# Patient Record
Sex: Female | Born: 1975
Health system: Southern US, Community
[De-identification: ages and names within clinical notes are randomized; demographics above are authoritative.]

## PROBLEM LIST (undated history)

## (undated) DIAGNOSIS — D573 Sickle-cell trait: Secondary | ICD-10-CM

## (undated) DIAGNOSIS — R2231 Localized swelling, mass and lump, right upper limb: Secondary | ICD-10-CM

## (undated) DIAGNOSIS — Z8742 Personal history of other diseases of the female genital tract: Secondary | ICD-10-CM

## (undated) HISTORY — DX: Sickle-cell trait: D57.3

---

## 2005-10-07 HISTORY — PX: LAPAROSCOPIC OVARIAN CYSTECTOMY: SUR786

## 2009-01-21 ENCOUNTER — Emergency Department (HOSPITAL_BASED_OUTPATIENT_CLINIC_OR_DEPARTMENT_OTHER): Admission: EM | Admit: 2009-01-21 | Discharge: 2009-01-21 | Payer: Self-pay | Admitting: Emergency Medicine

## 2009-01-21 ENCOUNTER — Ambulatory Visit: Payer: Self-pay | Admitting: Radiology

## 2011-09-20 ENCOUNTER — Other Ambulatory Visit: Payer: Self-pay | Admitting: Family Medicine

## 2011-09-20 DIAGNOSIS — R609 Edema, unspecified: Secondary | ICD-10-CM

## 2011-10-04 ENCOUNTER — Ambulatory Visit
Admission: RE | Admit: 2011-10-04 | Discharge: 2011-10-04 | Disposition: A | Discharge status: Home / Self Care | Payer: Managed Care, Other (non HMO) | Source: Ambulatory Visit | Attending: Family Medicine | Admitting: Family Medicine

## 2011-10-04 DIAGNOSIS — R609 Edema, unspecified: Secondary | ICD-10-CM

## 2011-10-05 ENCOUNTER — Other Ambulatory Visit: Payer: Self-pay

## 2012-07-03 ENCOUNTER — Encounter: Payer: Self-pay | Admitting: Obstetrics and Gynecology

## 2012-07-03 ENCOUNTER — Ambulatory Visit (INDEPENDENT_AMBULATORY_CARE_PROVIDER_SITE_OTHER): Payer: Managed Care, Other (non HMO) | Admitting: Obstetrics and Gynecology

## 2012-07-03 ENCOUNTER — Telehealth: Payer: Self-pay | Admitting: Obstetrics and Gynecology

## 2012-07-03 VITALS — BP 98/62 | HR 80 | Ht 68.0 in | Wt 174.0 lb

## 2012-07-03 DIAGNOSIS — Z01419 Encounter for gynecological examination (general) (routine) without abnormal findings: Secondary | ICD-10-CM

## 2012-07-03 DIAGNOSIS — Z1231 Encounter for screening mammogram for malignant neoplasm of breast: Secondary | ICD-10-CM

## 2012-07-03 DIAGNOSIS — Z124 Encounter for screening for malignant neoplasm of cervix: Secondary | ICD-10-CM

## 2012-07-03 MED ORDER — NORETHINDRONE-ETH ESTRADIOL 0.5-35 MG-MCG PO TABS: 1.0000 | ORAL_TABLET | Freq: Every day | ORAL | Status: DC

## 2012-07-03 NOTE — Telephone Encounter (Signed)
Orders put in for mammo

## 2012-07-03 NOTE — Progress Notes (Signed)
Regular Periods: yes Mammogram: yes  Monthly Breast Ex.: yes Exercise: no  Tetanus < 10 years: no Pt is unsure Seatbelts: yes  NI. Bladder Functn.: yes Abuse at home: no  Daily BM's: yes Stressful Work: yes  Healthy Diet: no Sigmoid-Colonoscopy: n/a  Calcium: no Medical problems this year: none   LAST PAP:06/2011 wnl  Contraception: pills  Mammogram:  3 years ago, pt has family hx of breast cancer  PCP: none  PMH: n/a  FMH: n/a  Last Bone Scan: n/a   NEW GYNECOLOGIC EXAMINATION  Wanda Crawford is an 36 y.o. female, G1P1001, who presents to the [facility] of [facility] for a new patient gynecologic examination.  Her complains are:her birth control pills were switched by her pharmacy.  She does not feel well on her current pills. .     Pertinent Gynecological History: Patient's last menstrual period was 06/14/2012. Menses: heavy with cramping Menarche: 13 Contraception: OCP (estrogen/progesterone) DES exposure: unknown Blood transfusions: none Sexually transmitted diseases: The patient reports a past history of: chlamydia. Previous GYN Procedures: laparoscopy  Last mammogram: normal Date: 2009 Last pap: normal Date: 2012 History of Abnormal Pap Smears:  Yes  Obstetrical History:  Vaginal Deliveries at Term:      0 Preterm Vaginal Deliveries:      0 Cesarean Deliveries at Term:  1 Preterm Cesareans:                 0 Miscarriages:                            0 Abortions:                                  0    Past Medical History  Diagnosis Date  . Sickle cell trait   . Anemia     low iron  . Headache, migraine   . Yeast infection   . Bacterial infection   . Abnormal Pap smear 2011    Past Surgical History  Procedure Date  . Laparoscopy 2006    Family History  Problem Relation Age of Onset  . Cancer Maternal Grandmother     breast  . Cancer Mother     breast     Social History:  reports that she has  never smoked. She does not have any smokeless tobacco history on file. She reports that she drinks about one ounce of alcohol per week. She reports that she does not use illicit drugs.  Allergies:  Allergies  Allergen Reactions  . Penicillins     Medications: I have reviewed the patient's current medications. Birth control peeled, sodium bicarb, iron  Review of Systems:  See history of present illness and gynecologic history  Physical Examination:  Blood pressure 98/62, pulse 80, height 5\' 8"  (1.727 m), weight 174 lb (78.926 kg), last menstrual period 06/14/2012. Body mass index is 26.46 kg/(m^2).  General: alert and no distress Resp: clear to auscultation bilaterally Cardio: regular rate and rhythm, S1, S2 normal, no murmur, click, rub or gallop GI: soft, non-tender; bowel sounds normal; no masses,  no organomegaly Breast: No masses, skin changes, or discharge  External genitalia: normal general appearance Vagina: No masses or lesions, relaxation: no Cervix: Nontender, No lesions Uterus: Normal size, shape, and consistency Adnexa: No masses, nontender Rectovaginal exam:Deferred  No results found for this or  any previous visit (from the past 48 hour(s)).  Wet Prep: None  Urine Pregnancy Test: None  Assessment:  Normal female examination  Overweight or obese: Yes   Pelvic relaxation: No  Does not like current birth control pills  Mother with premenopausal breast cancer.  Maternal grandmother with postmenopausal breast cancer.   Plan:    mammogram pap smear return annually or prn Contraception:Necon 1/35.  Patient denies contraindications   STD screen request:  Yes , GC and Chlamydia  The updated Pap smear screening guidelines were discussed with the patient. The patient requested that I obtain a Pap smear: Yes.  Kegel exercises discussed: Yes.  Proper diet and regular exercise were reviewed.  Annual mammograms recommended starting at age 15. Proper  breast care was discussed.  Screening colonoscopy is recommended beginning at age 48.  Regular health maintenance was reviewed.  Sleep hygiene was discussed.  Medications Prescribed:  Necon 1/35  Return to Office: 1 year or as needed.  One year for annual exam or as needed for difficulties.  Leonard Schwartz, M.D. 07/03/2012

## 2012-07-06 LAB — PAP IG, CT-NG, RFX HPV ASCU

## 2012-07-26 ENCOUNTER — Ambulatory Visit
Admission: RE | Admit: 2012-07-26 | Discharge: 2012-07-26 | Disposition: A | Discharge status: Home / Self Care | Payer: Managed Care, Other (non HMO) | Source: Ambulatory Visit | Attending: Obstetrics and Gynecology | Admitting: Obstetrics and Gynecology

## 2012-07-26 DIAGNOSIS — Z1231 Encounter for screening mammogram for malignant neoplasm of breast: Secondary | ICD-10-CM

## 2012-07-31 ENCOUNTER — Encounter: Payer: Self-pay | Admitting: Obstetrics and Gynecology

## 2013-04-30 DIAGNOSIS — D509 Iron deficiency anemia, unspecified: Secondary | ICD-10-CM | POA: Insufficient documentation

## 2013-04-30 DIAGNOSIS — R319 Hematuria, unspecified: Secondary | ICD-10-CM | POA: Insufficient documentation

## 2013-05-04 ENCOUNTER — Emergency Department (HOSPITAL_BASED_OUTPATIENT_CLINIC_OR_DEPARTMENT_OTHER): Payer: No Typology Code available for payment source

## 2013-05-04 ENCOUNTER — Encounter (HOSPITAL_BASED_OUTPATIENT_CLINIC_OR_DEPARTMENT_OTHER): Payer: Self-pay | Admitting: *Deleted

## 2013-05-04 ENCOUNTER — Emergency Department (HOSPITAL_BASED_OUTPATIENT_CLINIC_OR_DEPARTMENT_OTHER)
Admission: EM | Admit: 2013-05-04 | Discharge: 2013-05-04 | Disposition: A | Discharge status: Home / Self Care | Payer: No Typology Code available for payment source | Attending: Emergency Medicine | Admitting: Emergency Medicine

## 2013-05-04 DIAGNOSIS — S139XXA Sprain of joints and ligaments of unspecified parts of neck, initial encounter: Secondary | ICD-10-CM | POA: Insufficient documentation

## 2013-05-04 DIAGNOSIS — Z8619 Personal history of other infectious and parasitic diseases: Secondary | ICD-10-CM | POA: Insufficient documentation

## 2013-05-04 DIAGNOSIS — S39012A Strain of muscle, fascia and tendon of lower back, initial encounter: Secondary | ICD-10-CM

## 2013-05-04 DIAGNOSIS — S161XXA Strain of muscle, fascia and tendon at neck level, initial encounter: Secondary | ICD-10-CM

## 2013-05-04 DIAGNOSIS — Z79899 Other long term (current) drug therapy: Secondary | ICD-10-CM | POA: Insufficient documentation

## 2013-05-04 DIAGNOSIS — Y92410 Unspecified street and highway as the place of occurrence of the external cause: Secondary | ICD-10-CM | POA: Insufficient documentation

## 2013-05-04 DIAGNOSIS — D649 Anemia, unspecified: Secondary | ICD-10-CM | POA: Insufficient documentation

## 2013-05-04 DIAGNOSIS — Z880 Allergy status to penicillin: Secondary | ICD-10-CM | POA: Insufficient documentation

## 2013-05-04 DIAGNOSIS — S335XXA Sprain of ligaments of lumbar spine, initial encounter: Secondary | ICD-10-CM | POA: Insufficient documentation

## 2013-05-04 DIAGNOSIS — Z3202 Encounter for pregnancy test, result negative: Secondary | ICD-10-CM | POA: Insufficient documentation

## 2013-05-04 DIAGNOSIS — Y9389 Activity, other specified: Secondary | ICD-10-CM | POA: Insufficient documentation

## 2013-05-04 DIAGNOSIS — Z8679 Personal history of other diseases of the circulatory system: Secondary | ICD-10-CM | POA: Insufficient documentation

## 2013-05-04 DIAGNOSIS — IMO0002 Reserved for concepts with insufficient information to code with codable children: Secondary | ICD-10-CM | POA: Insufficient documentation

## 2013-05-04 LAB — URINALYSIS, ROUTINE W REFLEX MICROSCOPIC
Bilirubin Urine: NEGATIVE
Glucose, UA: NEGATIVE mg/dL
Hgb urine dipstick: NEGATIVE
Ketones, ur: NEGATIVE mg/dL
Leukocytes, UA: NEGATIVE
Nitrite: NEGATIVE
Protein, ur: NEGATIVE mg/dL
Specific Gravity, Urine: 1.014 (ref 1.005–1.030)
Urobilinogen, UA: 0.2 mg/dL (ref 0.0–1.0)
pH: 7.5 (ref 5.0–8.0)

## 2013-05-04 LAB — PREGNANCY, URINE: Preg Test, Ur: NEGATIVE

## 2013-05-04 MED ORDER — NAPROXEN 500 MG PO TABS: 500.0000 mg | ORAL_TABLET | Freq: Two times a day (BID) | ORAL | Status: DC

## 2013-05-04 MED ORDER — CYCLOBENZAPRINE HCL 10 MG PO TABS: 10.0000 mg | ORAL_TABLET | Freq: Two times a day (BID) | ORAL | Status: DC | PRN

## 2013-05-04 MED ORDER — IBUPROFEN 800 MG PO TABS
800.0000 mg | ORAL_TABLET | Freq: Once | ORAL | Status: AC
  Administered 2013-05-04: 800 mg via ORAL
  Filled 2013-05-04: qty 1

## 2013-05-04 NOTE — ED Provider Notes (Signed)
Medical screening examination/treatment/procedure(s) were performed by non-physician practitioner and as supervising physician I was immediately available for consultation/collaboration.  Raeford Razor, MD 05/04/13 [phone]
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
## 2013-07-10 ENCOUNTER — Other Ambulatory Visit: Payer: Self-pay

## 2013-07-10 DIAGNOSIS — Z1231 Encounter for screening mammogram for malignant neoplasm of breast: Secondary | ICD-10-CM

## 2013-07-29 ENCOUNTER — Ambulatory Visit: Payer: Managed Care, Other (non HMO)

## 2013-08-04 ENCOUNTER — Encounter (HOSPITAL_BASED_OUTPATIENT_CLINIC_OR_DEPARTMENT_OTHER): Payer: Self-pay | Admitting: *Deleted

## 2013-08-04 ENCOUNTER — Emergency Department (HOSPITAL_BASED_OUTPATIENT_CLINIC_OR_DEPARTMENT_OTHER)
Admission: EM | Admit: 2013-08-04 | Discharge: 2013-08-04 | Disposition: A | Discharge status: Home / Self Care | Payer: Managed Care, Other (non HMO) | Attending: Emergency Medicine | Admitting: Emergency Medicine

## 2013-08-04 DIAGNOSIS — Z79899 Other long term (current) drug therapy: Secondary | ICD-10-CM | POA: Insufficient documentation

## 2013-08-04 DIAGNOSIS — IMO0001 Reserved for inherently not codable concepts without codable children: Secondary | ICD-10-CM | POA: Insufficient documentation

## 2013-08-04 DIAGNOSIS — D509 Iron deficiency anemia, unspecified: Secondary | ICD-10-CM | POA: Insufficient documentation

## 2013-08-04 DIAGNOSIS — J0100 Acute maxillary sinusitis, unspecified: Secondary | ICD-10-CM | POA: Insufficient documentation

## 2013-08-04 DIAGNOSIS — D573 Sickle-cell trait: Secondary | ICD-10-CM | POA: Insufficient documentation

## 2013-08-04 DIAGNOSIS — Z880 Allergy status to penicillin: Secondary | ICD-10-CM | POA: Insufficient documentation

## 2013-08-04 DIAGNOSIS — Z791 Long term (current) use of non-steroidal anti-inflammatories (NSAID): Secondary | ICD-10-CM | POA: Insufficient documentation

## 2013-08-04 DIAGNOSIS — J069 Acute upper respiratory infection, unspecified: Secondary | ICD-10-CM

## 2013-08-04 DIAGNOSIS — Z8619 Personal history of other infectious and parasitic diseases: Secondary | ICD-10-CM | POA: Insufficient documentation

## 2013-08-04 DIAGNOSIS — G43909 Migraine, unspecified, not intractable, without status migrainosus: Secondary | ICD-10-CM | POA: Insufficient documentation

## 2013-08-04 LAB — RAPID STREP SCREEN (MED CTR MEBANE ONLY): Streptococcus, Group A Screen (Direct): NEGATIVE

## 2013-08-04 MED ORDER — IBUPROFEN 600 MG PO TABS: 600.0000 mg | ORAL_TABLET | Freq: Four times a day (QID) | ORAL | Status: DC | PRN

## 2013-08-04 MED ORDER — MOMETASONE FUROATE 50 MCG/ACT NA SUSP: 2.0000 | Freq: Every day | NASAL | Status: DC

## 2013-08-04 MED ORDER — AZITHROMYCIN 250 MG PO TABS: ORAL_TABLET | ORAL | Status: DC

## 2013-08-04 MED ORDER — OXYMETAZOLINE HCL 0.05 % NA SOLN: 2.0000 | Freq: Two times a day (BID) | NASAL | Status: DC

## 2013-08-04 NOTE — Discharge Instructions (Signed)
Sinusitis  Sinusitis is redness, soreness, and swelling (inflammation) of the paranasal sinuses. Paranasal sinuses are air pockets within the bones of your face (beneath the eyes, the middle of the forehead, or above the eyes). In healthy paranasal sinuses, mucus is able to drain out, and air is able to circulate through them by way of your nose. However, when your paranasal sinuses are inflamed, mucus and air can become trapped. This can allow bacteria and other germs to grow and cause infection.  Sinusitis can develop quickly and last only a short time (acute) or continue over a long period (chronic). Sinusitis that lasts for more than 12 weeks is considered chronic.   CAUSES   Causes of sinusitis include:  · Allergies.  · Structural abnormalities, such as displacement of the cartilage that separates your nostrils (deviated septum), which can decrease the air flow through your nose and sinuses and affect sinus drainage.  · Functional abnormalities, such as when the small hairs (cilia) that line your sinuses and help remove mucus do not work properly or are not present.  SYMPTOMS   Symptoms of acute and chronic sinusitis are the same. The primary symptoms are pain and pressure around the affected sinuses. Other symptoms include:  · Upper toothache.  · Earache.  · Headache.  · Bad breath.  · Decreased sense of smell and taste.  · A cough, which worsens when you are lying flat.  · Fatigue.  · Fever.  · Thick drainage from your nose, which often is green and may contain pus (purulent).  · Swelling and warmth over the affected sinuses.  DIAGNOSIS   Your caregiver will perform a physical exam. During the exam, your caregiver may:  · Look in your nose for signs of abnormal growths in your nostrils (nasal polyps).  · Tap over the affected sinus to check for signs of infection.  · View the inside of your sinuses (endoscopy) with a special imaging device with a light attached (endoscope), which is inserted into your  sinuses.  If your caregiver suspects that you have chronic sinusitis, one or more of the following tests may be recommended:  · Allergy tests.  · Nasal culture A sample of mucus is taken from your nose and sent to a lab and screened for bacteria.  · Nasal cytology A sample of mucus is taken from your nose and examined by your caregiver to determine if your sinusitis is related to an allergy.  TREATMENT   Most cases of acute sinusitis are related to a viral infection and will resolve on their own within 10 days. Sometimes medicines are prescribed to help relieve symptoms (pain medicine, decongestants, nasal steroid sprays, or saline sprays).   However, for sinusitis related to a bacterial infection, your caregiver will prescribe antibiotic medicines. These are medicines that will help kill the bacteria causing the infection.   Rarely, sinusitis is caused by a fungal infection. In theses cases, your caregiver will prescribe antifungal medicine.  For some cases of chronic sinusitis, surgery is needed. Generally, these are cases in which sinusitis recurs more than 3 times per year, despite other treatments.  HOME CARE INSTRUCTIONS   · Drink plenty of water. Water helps thin the mucus so your sinuses can drain more easily.  · Use a humidifier.  · Inhale steam 3 to 4 times a day (for example, sit in the bathroom with the shower running).  · Apply a warm, moist washcloth to your face 3 to 4 times a day,   or as directed by your caregiver.  · Use saline nasal sprays to help moisten and clean your sinuses.  · Take over-the-counter or prescription medicines for pain, discomfort, or fever only as directed by your caregiver.  SEEK IMMEDIATE MEDICAL CARE IF:  · You have increasing pain or severe headaches.  · You have nausea, vomiting, or drowsiness.  · You have swelling around your face.  · You have vision problems.  · You have a stiff neck.  · You have difficulty breathing.  MAKE SURE YOU:   · Understand these  instructions.  · Will watch your condition.  · Will get help right away if you are not doing well or get worse.  Document Released: 10/24/2005 Document Revised: 01/16/2012 Document Reviewed: 11/08/2011  ExitCare® Patient Information ©2014 [employer].  Upper Respiratory Infection, Adult  An upper respiratory infection (URI) is also sometimes known as the common cold. The upper respiratory tract includes the nose, sinuses, throat, trachea, and bronchi. Bronchi are the airways leading to the lungs. Most people improve within 1 week, but symptoms can last up to 2 weeks. A residual cough may last even longer.   CAUSES  Many different viruses can infect the tissues lining the upper respiratory tract. The tissues become irritated and inflamed and often become very moist. Mucus production is also common. A cold is contagious. You can easily spread the virus to others by oral contact. This includes kissing, sharing a glass, coughing, or sneezing. Touching your mouth or nose and then touching a surface, which is then touched by another person, can also spread the virus.  SYMPTOMS   Symptoms typically develop 1 to 3 days after you come in contact with a cold virus. Symptoms vary from person to person. They may include:  · Runny nose.  · Sneezing.  · Nasal congestion.  · Sinus irritation.  · Sore throat.  · Loss of voice (laryngitis).  · Cough.  · Fatigue.  · Muscle aches.  · Loss of appetite.  · Headache.  · Low-grade fever.  DIAGNOSIS   You might diagnose your own cold based on familiar symptoms, since most people get a cold 2 to 3 times a year. Your caregiver can confirm this based on your exam. Most importantly, your caregiver can check that your symptoms are not due to another disease such as strep throat, sinusitis, pneumonia, asthma, or epiglottitis. Blood tests, throat tests, and X-rays are not necessary to diagnose a common cold, but they may sometimes be helpful in excluding other more serious diseases. Your  caregiver will decide if any further tests are required.  RISKS AND COMPLICATIONS   You may be at risk for a more severe case of the common cold if you smoke cigarettes, have chronic heart disease (such as heart failure) or lung disease (such as asthma), or if you have a weakened immune system. The very young and very old are also at risk for more serious infections. Bacterial sinusitis, middle ear infections, and bacterial pneumonia can complicate the common cold. The common cold can worsen asthma and chronic obstructive pulmonary disease (COPD). Sometimes, these complications can require emergency medical care and may be life-threatening.  PREVENTION   The best way to protect against getting a cold is to practice good hygiene. Avoid oral or hand contact with people with cold symptoms. Wash your hands often if contact occurs. There is no clear evidence that vitamin C, vitamin E, echinacea, or exercise reduces the chance of developing a cold. However,   it is always recommended to get plenty of rest and practice good nutrition.  TREATMENT   Treatment is directed at relieving symptoms. There is no cure. Antibiotics are not effective, because the infection is caused by a virus, not by bacteria. Treatment may include:  · Increased fluid intake. Sports drinks offer valuable electrolytes, sugars, and fluids.  · Breathing heated mist or steam (vaporizer or shower).  · Eating chicken soup or other clear broths, and maintaining good nutrition.  · Getting plenty of rest.  · Using gargles or lozenges for comfort.  · Controlling fevers with ibuprofen or acetaminophen as directed by your caregiver.  · Increasing usage of your inhaler if you have asthma.  Zinc gel and zinc lozenges, taken in the first 24 hours of the common cold, can shorten the duration and lessen the severity of symptoms. Pain medicines may help with fever, muscle aches, and throat pain. A variety of non-prescription medicines are available to treat congestion  and runny nose. Your caregiver can make recommendations and may suggest nasal or lung inhalers for other symptoms.   HOME CARE INSTRUCTIONS   · Only take over-the-counter or prescription medicines for pain, discomfort, or fever as directed by your caregiver.  · Use a warm mist humidifier or inhale steam from a shower to increase air moisture. This may keep secretions moist and make it easier to breathe.  · Drink enough water and fluids to keep your urine clear or pale yellow.  · Rest as needed.  · Return to work when your temperature has returned to normal or as your caregiver advises. You may need to stay home longer to avoid infecting others. You can also use a face mask and careful hand washing to prevent spread of the virus.  SEEK MEDICAL CARE IF:   · After the first few days, you feel you are getting worse rather than better.  · You need your caregiver's advice about medicines to control symptoms.  · You develop chills, worsening shortness of breath, or brown or red sputum. These may be signs of pneumonia.  · You develop yellow or brown nasal discharge or pain in the face, especially when you bend forward. These may be signs of sinusitis.  · You develop a fever, swollen neck glands, pain with swallowing, or white areas in the back of your throat. These may be signs of strep throat.  SEEK IMMEDIATE MEDICAL CARE IF:   · You have a fever.  · You develop severe or persistent headache, ear pain, sinus pain, or chest pain.  · You develop wheezing, a prolonged cough, cough up blood, or have a change in your usual mucus (if you have chronic lung disease).  · You develop sore muscles or a stiff neck.  Document Released: 04/19/2001 Document Revised: 01/16/2012 Document Reviewed: 02/25/2011  ExitCare® Patient Information ©2014 [employer].

## 2013-08-04 NOTE — ED Provider Notes (Signed)
CSN: [id number]     Arrival date & time 08/04/13  1851 History  This chart was scribed for Loren Racer, MD by Shari Heritage, ED Scribe. The patient was seen in room MH11/MH11. Patient's care was started at 8:25 PM.    Chief Complaint  Patient presents with  . Sore Throat    Patient is a 37 y.o. female presenting with pharyngitis.  Sore Throat This is a new problem. The current episode started 2 days ago. The problem occurs constantly. The problem has not changed since onset.Associated symptoms include headaches. Pertinent negatives include no chest pain, no abdominal pain and no shortness of breath. Treatments tried: Mucinex DM.    HPI Comments: Wanda Crawford is a 37 y.o. female who presents to the Emergency Department complaining of constant, moderate sore throat onset 2 days ago. There is associated subjective fever, congestion, headache, body aches, rhinorrhea with yellow mucous. She describes headache as a migraine and states that she has had them infrequently in the past. She denies cough, ear fullness, otalgia, shortness of breath, facial pain, chills or any other symptoms at this time. She has been taking Mucinex DM at home. She works at a nursing home. She has a medical history of sickle cell trait.   Past Medical History  Diagnosis Date  . Sickle cell trait   . Anemia     low iron  . Headache, migraine   . Yeast infection   . Bacterial infection   . Abnormal Pap smear 2011   Past Surgical History  Procedure Laterality Date  . Laparoscopy  2006   Family History  Problem Relation Age of Onset  . Cancer Maternal Grandmother     breast  . Cancer Mother     breast    History  Substance Use Topics  . Smoking status: Never Smoker   . Smokeless tobacco: Never Used  . Alcohol Use: 4.2 oz/week    7 Glasses of wine per week   OB History   Grav Para Term Preterm Abortions TAB SAB Ect Mult Living   1 1 1       1      Review of Systems  Constitutional: Positive for  fever. Negative for chills.  HENT: Positive for congestion and rhinorrhea. Negative for ear pain and ear discharge.   Respiratory: Negative for cough and shortness of breath.   Cardiovascular: Negative for chest pain.  Gastrointestinal: Negative for abdominal pain.  Musculoskeletal: Positive for myalgias.  Neurological: Positive for headaches.  All other systems reviewed and are negative.    Allergies  Penicillins  Home Medications   Current Outpatient Rx  Name  Route  Sig  Dispense  Refill  . IRON PO   Oral   Take by mouth. Pt is unsure of dosage         . norethindrone-ethinyl estradiol (NECON 0.5/35, 28,) 0.5-35 MG-MCG tablet   Oral   Take 1 tablet by mouth daily.   1 Package   11   . SODIUM BICARBONATE PO   Oral   Take by mouth.         . cyclobenzaprine (FLEXERIL) 10 MG tablet   Oral   Take 1 tablet (10 mg total) by mouth 2 (two) times daily as needed for muscle spasms.   20 tablet   0   . levonorgestrel-ethinyl estradiol (NORDETTE) 0.15-30 MG-MCG tablet   Oral   Take 1 tablet by mouth daily.         . naproxen (NAPROSYN)  500 MG tablet   Oral   Take 1 tablet (500 mg total) by mouth 2 (two) times daily.   30 tablet   0    Triage Vitals: BP 107/70  Pulse 112  Temp(Src) 99 F (37.2 C) (Oral)  Resp 20  Ht 5\' 8"  (1.727 m)  Wt 160 lb (72.576 kg)  BMI 24.33 kg/m2  SpO2 100%  LMP 07/17/2013 Physical Exam  Constitutional: She is oriented to person, place, and time. She appears well-developed and well-nourished. No distress.  HENT:  Head: Normocephalic and atraumatic.  Nose: Mucosal edema and rhinorrhea present. Right sinus exhibits maxillary sinus tenderness. Left sinus exhibits maxillary sinus tenderness.  Mouth/Throat: Oropharynx is clear and moist.  Nasal mucosal edema. Diffusely tender maxillary and frontal sinuses bilaterally.  Eyes: Conjunctivae and EOM are normal. Pupils are equal, round, and reactive to light.  Neck: Normal range of motion.  Neck supple.  No nuchal rigidity.   Cardiovascular: Normal rate, regular rhythm and normal heart sounds.   No murmur heard. Pulmonary/Chest: Effort normal and breath sounds normal. No respiratory distress. She has no wheezes. She has no rales.  Musculoskeletal: Normal range of motion.  Neurological: She is alert and oriented to person, place, and time.  Skin: Skin is warm and dry.  Psychiatric: She has a normal mood and affect. Her behavior is normal.    ED Course  Procedures (including critical care time) DIAGNOSTIC STUDIES: Oxygen Saturation is 100% on room air, normal by my interpretation.    COORDINATION OF CARE: 8:29 PM- Patient informed of current plan for treatment and evaluation and agrees with plan at this time.   Results for orders placed during the hospital encounter of 08/04/13  RAPID STREP SCREEN      Result Value Range   Streptococcus, Group A Screen (Direct) NEGATIVE  NEGATIVE     Imaging Review No results found.  MDM  I personally performed the services described in this documentation, which was scribed in my presence. The recorded information has been reviewed and is accurate. Patient is well-appearing. Her symptoms are consistent with URI and sinusitis. Her lungs are clear she has minimal cough. Believe chest x-ray as needed this point. Return precautions have been given the patient voices understanding.  Loren Racer, MD 08/04/13 2044

## 2013-08-04 NOTE — ED Notes (Signed)
C/o sore throat fever and body aches x 2 days

## 2013-08-07 LAB — CULTURE, GROUP A STREP

## 2014-09-08 ENCOUNTER — Encounter (HOSPITAL_BASED_OUTPATIENT_CLINIC_OR_DEPARTMENT_OTHER): Payer: Self-pay | Admitting: *Deleted

## 2015-04-23 ENCOUNTER — Encounter: Payer: Self-pay | Admitting: Medical

## 2015-04-23 ENCOUNTER — Ambulatory Visit (INDEPENDENT_AMBULATORY_CARE_PROVIDER_SITE_OTHER): Payer: 59 | Admitting: Medical

## 2015-04-23 VITALS — BP 135/85 | HR 79 | Temp 98.5°F | Ht 67.0 in | Wt 178.4 lb

## 2015-04-23 DIAGNOSIS — L989 Disorder of the skin and subcutaneous tissue, unspecified: Secondary | ICD-10-CM | POA: Diagnosis not present

## 2015-04-23 DIAGNOSIS — G43809 Other migraine, not intractable, without status migrainosus: Secondary | ICD-10-CM

## 2015-04-23 DIAGNOSIS — M6740 Ganglion, unspecified site: Secondary | ICD-10-CM

## 2015-04-23 DIAGNOSIS — J301 Allergic rhinitis due to pollen: Secondary | ICD-10-CM

## 2015-04-23 DIAGNOSIS — J309 Allergic rhinitis, unspecified: Secondary | ICD-10-CM | POA: Insufficient documentation

## 2015-04-23 DIAGNOSIS — Z862 Personal history of diseases of the blood and blood-forming organs and certain disorders involving the immune mechanism: Secondary | ICD-10-CM

## 2015-04-23 DIAGNOSIS — G43909 Migraine, unspecified, not intractable, without status migrainosus: Secondary | ICD-10-CM | POA: Insufficient documentation

## 2015-04-23 NOTE — Progress Notes (Signed)
Pre visit review using our clinic review tool, if applicable. No additional management support is needed unless otherwise documented below in the visit note. 

## 2015-04-23 NOTE — Assessment & Plan Note (Signed)
Sickle cell trait.Pt sees hematologist once a year. [place].

## 2015-04-23 NOTE — Assessment & Plan Note (Signed)
-   Pt states 1 yr or more since last migraine. Pt would use excedrin or bc powder.

## 2015-04-23 NOTE — Patient Instructions (Signed)
Refer to dermatologist and hand specialist.  Consider if you want wellness exam over next year. This would include cbc, cmp, tsh, and lipid panel fasting.  Continue current meds for chronic problems.   Follow up 1 month or as needed

## 2015-04-23 NOTE — Assessment & Plan Note (Signed)
By exam the area over 4th mcp region I think favors ganglion cyst. Will refer to hand surgeon.

## 2015-04-23 NOTE — Assessment & Plan Note (Signed)
Refer to dermatologis. Some feature of keloid behind rt ear. Irregular mole lt calf which may need excisoin and path eval. Also can review skin lesion distal 3rd and 4th dip area.

## 2015-04-23 NOTE — Progress Notes (Signed)
Subjective:    Patient ID: Wanda Crawford, female    DOB: Dec 02, 1975, 39 y.o.   MRN: [id number]  HPI  I have reviewed pt PMH, PSH, FH, Social History and Surgical History  Allergies- year round. zytrec occasionaly.  Sickle cell trait.Pt sees hematologist once a year. [place].  Migraines- Pt states 1 yr or more since last migraine. Pt would use excedrin or bc powder.   Abnormal pap smear- 5 years ago followed by gynecologist and resolved per pt. Did not need any procedures per pt.   Only surgery for endometriosis in [address].  Pt mom had breast cancer in 1996. Mom was 41 yo when had. Mom died recently from chf recently.   Pt in for 3 problems. First some raises bumps. Nodule type area on her rt hand. Pt wants to see derm for referral. But she does not have any joint pain. This has been present for about a month.   Also left upper calf mole appearance for 3 month. This has been like this for years.  Also are rt ear lobe area thinks keloid.   LMP- today.   Pt gets regular mammograms since age 32 yr.Those have been negative.   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  Past Medical History  Diagnosis Date  . Sickle cell trait   . Headache, migraine   . Yeast infection   . Bacterial infection   . Abnormal Pap smear 2011  . Allergy   . Sickle cell anemia     Has trait    History   Social History  . Marital Status: Married    Spouse Name: N/A  . Number of Children: N/A  . Years of Education: N/A   Occupational History  . Not on file.   Social History Main Topics  . Smoking status: Never Smoker   . Smokeless tobacco: Never Used  . Alcohol Use: 1.2 oz/week    2 Glasses of wine per week     Comment: 2 glasses of wine.   . Drug Use: No  . Sexual Activity:    Partners: Male    Birth Control/ Protection: Pill     Comment: portia   Other Topics Concern  . Not on file   Social History Narrative    Past Surgical History    Procedure Laterality Date  . Laparoscopy  2006  . Laproscopy      Removed cysts from ovaries    Family History  Problem Relation Age of Onset  . Cancer Maternal Grandmother     breast  . Cancer Mother     breast   . Heart disease Mother     Allergies  Allergen Reactions  . Penicillins     No current outpatient prescriptions on file prior to visit.   No current facility-administered medications on file prior to visit.    BP 135/85 mmHg  Pulse 79  Temp(Src) 98.5 F (36.9 C) (Oral)  Ht 5\' 7"  (1.702 m)  Wt 178 lb 6.4 oz (80.922 kg)  BMI 27.93 kg/m2  SpO2 100%  LMP 04/23/2015     Review of Systems  Constitutional: Negative for fever, chills, diaphoresis, activity change and fatigue.  Respiratory: Negative for cough, chest tightness and shortness of breath.   Cardiovascular: Negative for chest pain, palpitations and leg swelling.  Gastrointestinal: Negative for nausea, vomiting and abdominal pain.  Musculoskeletal: Negative for neck pain and neck stiffness.  Rt 4t didit over mcp joint raised cyst like lesion.  Skin:       Lt calf lesion. Rt ear lobe probable keloid. Rt 3rd digit at dip small raised area dorsal aspect. Rt 4th digit at dip small raised area dorsal aspect.  Neurological: Negative for dizziness, tremors, seizures, syncope, facial asymmetry, speech difficulty, weakness, light-headedness, numbness and headaches.  Psychiatric/Behavioral: Negative for behavioral problems, confusion and agitation. The patient is not nervous/anxious.     Past Medical History  Diagnosis Date  . Sickle cell trait   . Headache, migraine   . Yeast infection   . Bacterial infection   . Abnormal Pap smear 2011  . Allergy   . Sickle cell anemia     Has trait    History   Social History  . Marital Status: Married    Spouse Name: N/A  . Number of Children: N/A  . Years of Education: N/A   Occupational History  . Not on file.   Social History Main Topics  .  Smoking status: Never Smoker   . Smokeless tobacco: Never Used  . Alcohol Use: 1.2 oz/week    2 Glasses of wine per week     Comment: 2 glasses of wine.   . Drug Use: No  . Sexual Activity:    Partners: Male    Birth Control/ Protection: Pill     Comment: portia   Other Topics Concern  . Not on file   Social History Narrative    Past Surgical History  Procedure Laterality Date  . Laparoscopy  2006  . Laproscopy      Removed cysts from ovaries    Family History  Problem Relation Age of Onset  . Cancer Maternal Grandmother     breast  . Cancer Mother     breast   . Heart disease Mother     Allergies  Allergen Reactions  . Penicillins     No current outpatient prescriptions on file prior to visit.   No current facility-administered medications on file prior to visit.    BP 135/85 mmHg  Pulse 79  Temp(Src) 98.5 F (36.9 C) (Oral)  Ht  (1.702 m)  Wt 178 lb 6.4 oz (80.922 kg)  BMI 27.93 kg/m2  SpO2 100%  LMP 04/23/2015      Objective:   Physical Exam  General- No acute distress. Pleasant patient. Neck- Full range of motion, no jvd Lungs- Clear, even and unlabored. Heart- regular rate and rhythm. Neurologic- CNII- XII grossly intact.  Skin-  Rt ear behind lobe small apparent keloid. Lt calf- 8-9 mm very dark rough irrregulr lesion with mild clearing in center. Rt hand- dorsal aspect of 4th digit over mcp feel like possible small ganglion cyst. Rt 3rd and 4th digit. Small raises lesion about 1-2 mm insize. Indurated feel to these areas.     Assessment & Plan:

## 2015-04-23 NOTE — Assessment & Plan Note (Signed)
Allergies- year round. zytrec occasionaly

## 2015-05-15 ENCOUNTER — Telehealth: Payer: Self-pay | Admitting: Medical

## 2015-05-15 ENCOUNTER — Ambulatory Visit (INDEPENDENT_AMBULATORY_CARE_PROVIDER_SITE_OTHER): Payer: 59 | Admitting: Psychology

## 2015-05-15 DIAGNOSIS — F4323 Adjustment disorder with mixed anxiety and depressed mood: Secondary | ICD-10-CM | POA: Diagnosis not present

## 2015-05-15 NOTE — Telephone Encounter (Signed)
FYI-  Pt was advised by Terri to schedule appointment with Dr. Laury AxonLowne. Pt is grieving the lost of mother. Pt scheduled 15 MINUTE acute appointment for 05/18/15.

## 2015-05-18 ENCOUNTER — Ambulatory Visit: Payer: 59 | Admitting: Family Medicine

## 2015-05-19 ENCOUNTER — Encounter: Payer: Self-pay | Admitting: Family Medicine

## 2015-05-19 ENCOUNTER — Telehealth: Payer: Self-pay | Admitting: Medical

## 2015-05-19 ENCOUNTER — Encounter: Payer: Self-pay | Admitting: Medical

## 2015-05-19 NOTE — Telephone Encounter (Signed)
no

## 2015-05-19 NOTE — Telephone Encounter (Signed)
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

## 2015-06-15 ENCOUNTER — Ambulatory Visit (INDEPENDENT_AMBULATORY_CARE_PROVIDER_SITE_OTHER): Payer: 59 | Admitting: Psychology

## 2015-06-15 ENCOUNTER — Encounter: Payer: Self-pay | Admitting: Family Medicine

## 2015-06-15 ENCOUNTER — Ambulatory Visit (INDEPENDENT_AMBULATORY_CARE_PROVIDER_SITE_OTHER): Payer: 59 | Admitting: Family Medicine

## 2015-06-15 VITALS — BP 112/76 | HR 76 | Temp 98.3°F | Wt 179.8 lb

## 2015-06-15 DIAGNOSIS — F4323 Adjustment disorder with mixed anxiety and depressed mood: Secondary | ICD-10-CM | POA: Diagnosis not present

## 2015-06-15 DIAGNOSIS — G4700 Insomnia, unspecified: Secondary | ICD-10-CM

## 2015-06-15 MED ORDER — ALPRAZOLAM 0.5 MG PO TABS: 0.5000 mg | ORAL_TABLET | Freq: Every day | ORAL | Status: DC

## 2015-06-15 NOTE — Progress Notes (Signed)
Pre visit review using our clinic review tool, if applicable. No additional management support is needed unless otherwise documented below in the visit note. 

## 2015-06-15 NOTE — Progress Notes (Signed)
Patient ID: Wanda Crawford, female    DOB: 11/10/1975  Age: 39 y.o. MRN: [id number]    Subjective:  Subjective HPI Wanda Crawford presents with c/o insomnia.  Her mom died in June---she had CHF secondary to chemo in her 17s .  She has not been able to sleep since her mom got sick. Her brain just will not turn off.  She is not depressed or suicidal.  She is seeing Terri for counseling.    Review of Systems  Constitutional: Negative for diaphoresis, appetite change, fatigue and unexpected weight change.  Eyes: Negative for pain, redness and visual disturbance.  Respiratory: Negative for cough, chest tightness, shortness of breath and wheezing.   Cardiovascular: Negative for chest pain, palpitations and leg swelling.  Endocrine: Negative for cold intolerance, heat intolerance, polydipsia, polyphagia and polyuria.  Genitourinary: Negative for dysuria, frequency and difficulty urinating.  Neurological: Negative for dizziness, light-headedness, numbness and headaches.  Psychiatric/Behavioral: Positive for sleep disturbance. Negative for suicidal ideas, self-injury and decreased concentration. The patient is not nervous/anxious.     History Past Medical History  Diagnosis Date  . Sickle cell trait   . Headache, migraine   . Yeast infection   . Bacterial infection   . Abnormal Pap smear 2011  . Allergy   . Sickle cell anemia     Has trait  . Anemia, iron deficiency 04/30/2013    She has past surgical history that includes laparoscopy (2006) and Laproscopy.   Her family history includes Cancer in her maternal grandmother; Cancer (age of onset: 66) in her mother; Heart disease (age of onset: 5) in her mother.She reports that she has never smoked. She has never used smokeless tobacco. She reports that she drinks about 1.2 oz of alcohol per week. She reports that she does not use illicit drugs.  No current outpatient prescriptions on file prior to visit.   No current facility-administered  medications on file prior to visit.     Objective:  Objective Physical Exam  Constitutional: She is oriented to person, place, and time. She appears well-developed and well-nourished.  HENT:  Head: Normocephalic and atraumatic.  Eyes: Conjunctivae and EOM are normal.  Neck: Normal range of motion. Neck supple. No JVD present. Carotid bruit is not present. No thyromegaly present.  Cardiovascular: Normal rate, regular rhythm and normal heart sounds.   No murmur heard. Pulmonary/Chest: Effort normal and breath sounds normal. No respiratory distress. She has no wheezes. She has no rales. She exhibits no tenderness.  Musculoskeletal: She exhibits no edema.  Neurological: She is alert and oriented to person, place, and time.  Psychiatric: She has a normal mood and affect. Her behavior is normal. Thought content normal. Her mood appears not anxious. Cognition and memory are normal. She does not exhibit a depressed mood.   BP 112/76 mmHg  Pulse 76  Temp(Src) 98.3 F (36.8 C) (Oral)  Wt 179 lb 12.8 oz (81.557 kg)  SpO2 99%  LMP 05/20/2015 Wt Readings from Last 3 Encounters:  06/15/15 179 lb 12.8 oz (81.557 kg)  04/23/15 178 lb 6.4 oz (80.922 kg)  08/04/13 160 lb (72.576 kg)     No results found for: WBC, HGB, HCT, PLT, GLUCOSE, CHOL, TRIG, HDL, LDLDIRECT, LDLCALC, ALT, AST, NA, K, CL, CREATININE, BUN, CO2, TSH, PSA, INR, GLUF, HGBA1C, MICROALBUR  No results found.   Assessment & Plan:  Plan I am having [name] start on ALPRAZolam. I am also having her maintain her norethindrone-ethinyl estradiol.  Meds ordered this encounter  Medications  . norethindrone-ethinyl estradiol (CYCLAFEM,ALYACEN) 0.5/0.75/1-35 MG-MCG tablet    Sig: Take 1 tablet by mouth daily.  Marland Kitchen ALPRAZolam (XANAX) 0.5 MG tablet    Sig: Take 1 tablet (0.5 mg total) by mouth at bedtime.    Dispense:  30 tablet    Refill:  0    Problem List Items Addressed This Visit    None    Visit Diagnoses    Insomnia     -  Primary    Relevant Medications    ALPRAZolam (XANAX) 0.5 MG tablet     con't counseling rto prn  Follow-up: Return in about 6 months (around 12/16/2015), or if symptoms worsen or fail to improve, for annual exam, fasting.  Loreen Freud, DO

## 2015-06-15 NOTE — Patient Instructions (Signed)
Insomnia Insomnia is frequent trouble falling and/or staying asleep. Insomnia can be a long term problem or a short term problem. Both are common. Insomnia can be a short term problem when the wakefulness is related to a certain stress or worry. Long term insomnia is often related to ongoing stress during waking hours and/or poor sleeping habits. Overtime, sleep deprivation itself can make the problem worse. Every little thing feels more severe because you are overtired and your ability to cope is decreased. CAUSES   Stress, anxiety, and depression.  Poor sleeping habits.  Distractions such as TV in the bedroom.  Naps close to bedtime.  Engaging in emotionally charged conversations before bed.  Technical reading before sleep.  Alcohol and other sedatives. They may make the problem worse. They can hurt normal sleep patterns and normal dream activity.  Stimulants such as caffeine for several hours prior to bedtime.  Pain syndromes and shortness of breath can cause insomnia.  Exercise late at night.  Changing time zones may cause sleeping problems (jet lag). It is sometimes helpful to have someone observe your sleeping patterns. They should look for periods of not breathing during the night (sleep apnea). They should also look to see how long those periods last. If you live alone or observers are uncertain, you can also be observed at a sleep clinic where your sleep patterns will be professionally monitored. Sleep apnea requires a checkup and treatment. Give your caregivers your medical history. Give your caregivers observations your family has made about your sleep.  SYMPTOMS   Not feeling rested in the morning.  Anxiety and restlessness at bedtime.  Difficulty falling and staying asleep. TREATMENT   Your caregiver may prescribe treatment for an underlying medical disorders. Your caregiver can give advice or help if you are using alcohol or other drugs for self-medication. Treatment  of underlying problems will usually eliminate insomnia problems.  Medications can be prescribed for short time use. They are generally not recommended for lengthy use.  Over-the-counter sleep medicines are not recommended for lengthy use. They can be habit forming.  You can promote easier sleeping by making lifestyle changes such as:  Using relaxation techniques that help with breathing and reduce muscle tension.  Exercising earlier in the day.  Changing your diet and the time of your last meal. No night time snacks.  Establish a regular time to go to bed.  Counseling can help with stressful problems and worry.  Soothing music and white noise may be helpful if there are background noises you cannot remove.  Stop tedious detailed work at least one hour before bedtime. HOME CARE INSTRUCTIONS   Keep a diary. Inform your caregiver about your progress. This includes any medication side effects. See your caregiver regularly. Take note of:  Times when you are asleep.  Times when you are awake during the night.  The quality of your sleep.  How you feel the next day. This information will help your caregiver care for you.  Get out of bed if you are still awake after 15 minutes. Read or do some quiet activity. Keep the lights down. Wait until you feel sleepy and go back to bed.  Keep regular sleeping and waking hours. Avoid naps.  Exercise regularly.  Avoid distractions at bedtime. Distractions include watching television or engaging in any intense or detailed activity like attempting to balance the household checkbook.  Develop a bedtime ritual. Keep a familiar routine of bathing, brushing your teeth, climbing into bed at the same   time each night, listening to soothing music. Routines increase the success of falling to sleep faster.  Use relaxation techniques. This can be using breathing and muscle tension release routines. It can also include visualizing peaceful scenes. You can  also help control troubling or intruding thoughts by keeping your mind occupied with boring or repetitive thoughts like the old concept of counting sheep. You can make it more creative like imagining planting one beautiful flower after another in your backyard garden.  During your day, work to eliminate stress. When this is not possible use some of the previous suggestions to help reduce the anxiety that accompanies stressful situations. MAKE SURE YOU:   Understand these instructions.  Will watch your condition.  Will get help right away if you are not doing well or get worse. Document Released: 10/21/2000 Document Revised: 01/16/2012 Document Reviewed: 11/21/2007 Exit[employer] Patient Information 2015 [employer]. This information is not intended to replace advice given to you by your health care provider. Make sure you discuss any questions you have with your health care provider.  

## 2015-06-25 ENCOUNTER — Encounter (HOSPITAL_BASED_OUTPATIENT_CLINIC_OR_DEPARTMENT_OTHER): Payer: Self-pay | Admitting: *Deleted

## 2015-07-08 ENCOUNTER — Ambulatory Visit: Payer: 59 | Admitting: Psychology

## 2015-07-08 ENCOUNTER — Encounter (HOSPITAL_BASED_OUTPATIENT_CLINIC_OR_DEPARTMENT_OTHER): Payer: Self-pay | Admitting: *Deleted

## 2015-07-10 ENCOUNTER — Encounter (HOSPITAL_BASED_OUTPATIENT_CLINIC_OR_DEPARTMENT_OTHER): Payer: Self-pay | Admitting: *Deleted

## 2015-07-10 NOTE — Progress Notes (Signed)
NPO AFTER MN WITH EXCEPTION CLEAR LIQUIDS UNTIL 0700 (NO CREAM /MILK PRODUCTS).  ARRIVE AT 1130. NEEDS HG AND URINE PREG.  

## 2015-07-16 ENCOUNTER — Ambulatory Visit (HOSPITAL_BASED_OUTPATIENT_CLINIC_OR_DEPARTMENT_OTHER)
Admission: RE | Admit: 2015-07-16 | Discharge: 2015-07-16 | Disposition: A | Discharge status: Home / Self Care | Payer: 59 | Source: Ambulatory Visit | Attending: Orthopedic Surgery | Admitting: Orthopedic Surgery

## 2015-07-16 ENCOUNTER — Encounter (HOSPITAL_BASED_OUTPATIENT_CLINIC_OR_DEPARTMENT_OTHER)
Admission: RE | Disposition: A | Discharge status: Home / Self Care | Payer: Self-pay | Source: Ambulatory Visit | Attending: Orthopedic Surgery

## 2015-07-16 ENCOUNTER — Encounter (HOSPITAL_BASED_OUTPATIENT_CLINIC_OR_DEPARTMENT_OTHER): Payer: Self-pay

## 2015-07-16 ENCOUNTER — Ambulatory Visit (HOSPITAL_BASED_OUTPATIENT_CLINIC_OR_DEPARTMENT_OTHER): Payer: 59 | Admitting: Anesthesiology

## 2015-07-16 DIAGNOSIS — M67441 Ganglion, right hand: Secondary | ICD-10-CM | POA: Insufficient documentation

## 2015-07-16 DIAGNOSIS — Z880 Allergy status to penicillin: Secondary | ICD-10-CM | POA: Diagnosis not present

## 2015-07-16 DIAGNOSIS — Z87891 Personal history of nicotine dependence: Secondary | ICD-10-CM | POA: Diagnosis not present

## 2015-07-16 DIAGNOSIS — M65841 Other synovitis and tenosynovitis, right hand: Secondary | ICD-10-CM | POA: Insufficient documentation

## 2015-07-16 DIAGNOSIS — D571 Sickle-cell disease without crisis: Secondary | ICD-10-CM | POA: Insufficient documentation

## 2015-07-16 DIAGNOSIS — R2231 Localized swelling, mass and lump, right upper limb: Secondary | ICD-10-CM

## 2015-07-16 HISTORY — DX: Localized swelling, mass and lump, right upper limb: R22.31

## 2015-07-16 HISTORY — PX: MASS EXCISION: SHX2000

## 2015-07-16 HISTORY — DX: Personal history of other diseases of the female genital tract: Z87.42

## 2015-07-16 LAB — POCT PREGNANCY, URINE: PREG TEST UR: NEGATIVE

## 2015-07-16 LAB — RAPID HIV SCREEN (HIV 1/2 AB+AG)
HIV 1/2 Antibodies: NONREACTIVE
HIV-1 P24 ANTIGEN - HIV24: NONREACTIVE

## 2015-07-16 LAB — POCT HEMOGLOBIN-HEMACUE: Hemoglobin: 11.5 g/dL — ABNORMAL LOW (ref 12.0–15.0)

## 2015-07-16 SURGERY — EXCISION MASS
Anesthesia: Monitor Anesthesia Care | Site: Finger

## 2015-07-16 MED ORDER — DEXAMETHASONE SODIUM PHOSPHATE 4 MG/ML IJ SOLN
INTRAMUSCULAR | Status: DC | PRN
  Administered 2015-07-16: 5 mg via INTRAVENOUS

## 2015-07-16 MED ORDER — FENTANYL CITRATE (PF) 100 MCG/2ML IJ SOLN
INTRAMUSCULAR | Status: DC | PRN
  Administered 2015-07-16 (×2): 50 ug via INTRAVENOUS

## 2015-07-16 MED ORDER — CHLORHEXIDINE GLUCONATE 4 % EX LIQD
60.0000 mL | Freq: Once | CUTANEOUS | Status: DC
  Filled 2015-07-16: qty 60

## 2015-07-16 MED ORDER — LIDOCAINE HCL (CARDIAC) 20 MG/ML IV SOLN
INTRAVENOUS | Status: DC | PRN
  Administered 2015-07-16: 50 mg via INTRAVENOUS

## 2015-07-16 MED ORDER — CLINDAMYCIN PHOSPHATE 900 MG/50ML IV SOLN
900.0000 mg | INTRAVENOUS | Status: AC
  Administered 2015-07-16: 900 mg via INTRAVENOUS
  Filled 2015-07-16: qty 50

## 2015-07-16 MED ORDER — MIDAZOLAM HCL 5 MG/5ML IJ SOLN
INTRAMUSCULAR | Status: DC | PRN
  Administered 2015-07-16: 2 mg via INTRAVENOUS

## 2015-07-16 MED ORDER — LACTATED RINGERS IV SOLN
INTRAVENOUS | Status: DC
  Administered 2015-07-16: 12:00:00 via INTRAVENOUS
  Filled 2015-07-16: qty 1000

## 2015-07-16 MED ORDER — CLINDAMYCIN PHOSPHATE 900 MG/50ML IV SOLN
INTRAVENOUS | Status: AC
  Filled 2015-07-16: qty 50

## 2015-07-16 MED ORDER — BUPIVACAINE HCL (PF) 0.25 % IJ SOLN
INTRAMUSCULAR | Status: DC | PRN
  Administered 2015-07-16: 5 mL

## 2015-07-16 MED ORDER — ONDANSETRON HCL 4 MG/2ML IJ SOLN
INTRAMUSCULAR | Status: DC | PRN
  Administered 2015-07-16: 4 mg via INTRAVENOUS

## 2015-07-16 MED ORDER — LIDOCAINE HCL 1 % IJ SOLN
INTRAMUSCULAR | Status: DC | PRN
  Administered 2015-07-16: 5 mL

## 2015-07-16 MED ORDER — PROPOFOL 500 MG/50ML IV EMUL
INTRAVENOUS | Status: DC | PRN
  Administered 2015-07-16: 100 ug/kg/min via INTRAVENOUS

## 2015-07-16 MED ORDER — HYDROCODONE-ACETAMINOPHEN 5-300 MG PO TABS: 1.0000 | ORAL_TABLET | Freq: Four times a day (QID) | ORAL | Status: DC | PRN

## 2015-07-16 SURGICAL SUPPLY — 52 items
BLADE SURG 15 STRL LF DISP TIS (BLADE) ×1 IMPLANT
BLADE SURG 15 STRL SS (BLADE) ×2
BNDG CONFORM 2 STRL LF (GAUZE/BANDAGES/DRESSINGS) ×3 IMPLANT
BNDG ESMARK 4X9 LF (GAUZE/BANDAGES/DRESSINGS) ×3 IMPLANT
BNDG GAUZE ELAST 4 BULKY (GAUZE/BANDAGES/DRESSINGS) IMPLANT
CLOTH BEACON ORANGE TIMEOUT ST (SAFETY) IMPLANT
CORDS BIPOLAR (ELECTRODE) ×3 IMPLANT
COVER BACK TABLE 60X90IN (DRAPES) ×3 IMPLANT
COVER MAYO STAND STRL (DRAPES) IMPLANT
DRAIN PENROSE 18X1/4 LTX STRL (WOUND CARE) IMPLANT
DRAPE EXTREMITY T 121X128X90 (DRAPE) ×3 IMPLANT
DRAPE LG THREE QUARTER DISP (DRAPES) ×3 IMPLANT
DRAPE SURG 17X23 STRL (DRAPES) ×3 IMPLANT
DRSG ADAPTIC 3X8 NADH LF (GAUZE/BANDAGES/DRESSINGS) ×3 IMPLANT
DRSG EMULSION OIL 3X3 NADH (GAUZE/BANDAGES/DRESSINGS) IMPLANT
ELECT NEEDLE TIP 2.8 STRL (NEEDLE) IMPLANT
ELECT REM PT RETURN 9FT ADLT (ELECTROSURGICAL) ×3
ELECTRODE REM PT RTRN 9FT ADLT (ELECTROSURGICAL) ×1 IMPLANT
GAUZE SPONGE 4X4 16PLY XRAY LF (GAUZE/BANDAGES/DRESSINGS) IMPLANT
GAUZE XEROFORM 1X8 LF (GAUZE/BANDAGES/DRESSINGS) IMPLANT
GLOVE BIO SURGEON STRL SZ8 (GLOVE) ×3 IMPLANT
GLOVE BIOGEL PI IND STRL 8.5 (GLOVE) ×1 IMPLANT
GLOVE BIOGEL PI INDICATOR 8.5 (GLOVE) ×2
GOWN STRL REUS W/TWL LRG LVL3 (GOWN DISPOSABLE) ×3 IMPLANT
GOWN STRL REUS W/TWL XL LVL3 (GOWN DISPOSABLE) ×3 IMPLANT
LOOP VESSEL MAXI BLUE (MISCELLANEOUS) IMPLANT
MANIFOLD NEPTUNE II (INSTRUMENTS) IMPLANT
NEEDLE HYPO 25X1 1.5 SAFETY (NEEDLE) ×3 IMPLANT
NS IRRIG 500ML POUR BTL (IV SOLUTION) ×3 IMPLANT
PACK BASIN DAY SURGERY FS (CUSTOM PROCEDURE TRAY) ×3 IMPLANT
PAD CAST 3X4 CTTN HI CHSV (CAST SUPPLIES) IMPLANT
PADDING CAST ABS 4INX4YD NS (CAST SUPPLIES) ×2
PADDING CAST ABS COTTON 4X4 ST (CAST SUPPLIES) ×1 IMPLANT
PADDING CAST COTTON 3X4 STRL (CAST SUPPLIES)
PENCIL BUTTON HOLSTER BLD 10FT (ELECTRODE) IMPLANT
SPLINT PLASTER CAST XFAST 3X15 (CAST SUPPLIES) IMPLANT
SPLINT PLASTER XTRA FASTSET 3X (CAST SUPPLIES)
SPONGE GAUZE 4X4 12PLY (GAUZE/BANDAGES/DRESSINGS) ×3 IMPLANT
SPONGE GAUZE 4X4 12PLY STER LF (GAUZE/BANDAGES/DRESSINGS) ×3 IMPLANT
STOCKINETTE 4X48 STRL (DRAPES) ×3 IMPLANT
SUT ETHIBOND 3-0 V-5 (SUTURE) ×3 IMPLANT
SUT ETHILON 4 0 P 3 18 (SUTURE) IMPLANT
SUT ETHILON 5 0 P 3 18 (SUTURE)
SUT NYLON ETHILON 5-0 P-3 1X18 (SUTURE) IMPLANT
SUT PROLENE 4 0 PS 2 18 (SUTURE) ×6 IMPLANT
SUT PROLENE 5 0 P 3 (SUTURE) ×3 IMPLANT
SYR BULB 3OZ (MISCELLANEOUS) ×3 IMPLANT
SYR CONTROL 10ML LL (SYRINGE) ×3 IMPLANT
TOWEL OR 17X24 6PK STRL BLUE (TOWEL DISPOSABLE) ×6 IMPLANT
TRAY DSU PREP LF (CUSTOM PROCEDURE TRAY) ×3 IMPLANT
UNDERPAD 30X30 INCONTINENT (UNDERPADS AND DIAPERS) ×3 IMPLANT
WATER STERILE IRR 500ML POUR (IV SOLUTION) ×3 IMPLANT

## 2015-07-16 NOTE — Transfer of Care (Signed)
Last Vitals:  Filed Vitals:   07/16/15 1144  BP: 134/90  Pulse: 80  Temp: 36.9 C  Resp: 18   Immediate Anesthesia Transfer of Care Note  Patient: Wanda Crawford  Procedure(s) Performed: Procedure(s) (LRB): RIGHT RING FINGER DEEP MASS EXCISION X 2 (N/A)  Patient Location: PACU  Anesthesia Type: MAC  Level of Consciousness: awake, alert  and oriented  Airway & Oxygen Therapy: Patient Spontanous Breathing and Patient connected to nasal cannula oxygen  Post-op Assessment: Report given to PACU RN and Post -op Vital signs reviewed and stable  Post vital signs: Reviewed and stable  Complications: No apparent anesthesia complications

## 2015-07-16 NOTE — Brief Op Note (Signed)
07/16/2015  1:19 PM  PATIENT:  Wanda Crawford  39 y.o. female  PRE-OPERATIVE DIAGNOSIS:  ganglion cyst of flexor tendon sheath of right ring finger  POST-OPERATIVE DIAGNOSIS:  * No post-op diagnosis entered *  PROCEDURE:  Procedure(s): RIGHT RING FINGER DEEP MASS EXCISION X 2 (N/A)  SURGEON:  Surgeon(s) and Role:    * Bradly Bienenstock, MD - Primary  PHYSICIAN ASSISTANT:   ASSISTANTS: none   ANESTHESIA:   local  EBL:     BLOOD ADMINISTERED:none  DRAINS: none   LOCAL MEDICATIONS USED:  MARCAINE     SPECIMEN:  No Specimen  DISPOSITION OF SPECIMEN:  PATHOLOGY  COUNTS:  YES  TOURNIQUET:  * No tourniquets in log *  DICTATION: .Other Dictation: Dictation Number [id number]  PLAN OF CARE: Discharge to home after PACU  PATIENT DISPOSITION:  PACU - hemodynamically stable.   Delay start of Pharmacological VTE agent (>24hrs) due to surgical blood loss or risk of bleeding: not applicable

## 2015-07-16 NOTE — Anesthesia Preprocedure Evaluation (Addendum)
Anesthesia Evaluation  Patient identified by MRN, date of birth, ID band Patient awake    Reviewed: Allergy & Precautions, NPO status , Patient's Chart, lab work & pertinent test results  Airway Mallampati: II  TM Distance: >3 FB Neck ROM: Full    Dental no notable dental hx.    Pulmonary former smoker,    Pulmonary exam normal breath sounds clear to auscultation       Cardiovascular negative cardio ROS Normal cardiovascular exam Rhythm:Regular Rate:Normal     Neuro/Psych  Headaches, negative neurological ROS  negative psych ROS   GI/Hepatic negative GI ROS, Neg liver ROS,   Endo/Other  negative endocrine ROS  Renal/GU negative Renal ROS     Musculoskeletal negative musculoskeletal ROS (+)   Abdominal   Peds  Hematology  (+) Blood dyscrasia, Sickle cell trait and anemia ,   Anesthesia Other Findings   Reproductive/Obstetrics negative OB ROS                           Anesthesia Physical Anesthesia Plan  ASA: II  Anesthesia Plan: MAC   Post-op Pain Management:    Induction: Intravenous  Airway Management Planned:   Additional Equipment:   Intra-op Plan:   Post-operative Plan:   Informed Consent: I have reviewed the patients History and Physical, chart, labs and discussed the procedure including the risks, benefits and alternatives for the proposed anesthesia with the patient or authorized representative who has indicated his/her understanding and acceptance.   Dental advisory given  Plan Discussed with: CRNA  Anesthesia Plan Comments:         Anesthesia Quick Evaluation

## 2015-07-16 NOTE — Anesthesia Postprocedure Evaluation (Signed)
Anesthesia Post Note  Patient: Wanda Crawford  Procedure(s) Performed: Procedure(s) (LRB): RIGHT RING FINGER DEEP MASS EXCISION X 2 (N/A)  Anesthesia type: MAC  Patient location: PACU  Post pain: Pain level controlled  Post assessment: Post-op Vital signs reviewed  Last Vitals: BP 115/92 mmHg  Pulse 77  Temp(Src) 36.1 C (Oral)  Resp 16  Ht  (1.727 m)  Wt 181 lb (82.101 kg)  BMI 27.53 kg/m2  SpO2 100%  LMP 06/17/2015  Post vital signs: Reviewed  Level of consciousness: awake  Complications: No apparent anesthesia complications

## 2015-07-16 NOTE — H&P (Signed)
Wanda Crawford is an 39 y.o. female.   Chief Complaint: right ring finger dorsal mass HPI: Pt followed in office Pt with dorsal masses over right ring finger Pt wants them removed No prior surgery to right hand  Past Medical History  Diagnosis Date  . Sickle cell trait   . History of abnormal cervical Pap smear     2011  . Mass of finger of right hand     ring    Past Surgical History  Procedure Laterality Date  . Laparoscopic ovarian cystectomy Bilateral dec 2006    Family History  Problem Relation Age of Onset  . Cancer Maternal Grandmother     breast  . Cancer Mother 20    breast   . Heart disease Mother 4    chf   Social History:  reports that she quit smoking about 9 years ago. Her smoking use included Cigarettes. She quit after 3 years of use. She has never used smokeless tobacco. She reports that she drinks alcohol. She reports that she does not use illicit drugs.  Allergies:  Allergies  Allergen Reactions  . Penicillins Hives    Medications Prior to Admission  Medication Sig Dispense Refill  . norethindrone-ethinyl estradiol (CYCLAFEM,ALYACEN) 0.5/0.75/1-35 MG-MCG tablet Take 1 tablet by mouth every evening.       Results for orders placed or performed during the hospital encounter of 07/16/15 (from the past 48 hour(s))  Pregnancy, urine POC     Status: None   Collection Time: 07/16/15 12:02 PM  Result Value Ref Range   Preg Test, Ur NEGATIVE NEGATIVE    Comment:        THE SENSITIVITY OF THIS METHODOLOGY IS >24 mIU/mL   Hemoglobin-hemacue, POC     Status: Abnormal   Collection Time: 07/16/15 12:22 PM  Result Value Ref Range   Hemoglobin 11.5 (L) 12.0 - 15.0 g/dL   No results found.  ROSNO RECENT ILLNESSES OR HOSPITALIZATIONS  Blood pressure 134/90, pulse 80, temperature 98.5 F (36.9 C), temperature source Oral, resp. rate 18, height 5\' 8"  (1.727 m), weight 82.101 kg (181 lb), last menstrual period 06/17/2015, SpO2 100 %. Physical Exam   General Appearance:  Alert, cooperative, no distress, appears stated age  Head:  Normocephalic, without obvious abnormality, atraumatic  Eyes:  Pupils equal, conjunctiva/corneas clear,         Throat: Lips, mucosa, and tongue normal; teeth and gums normal  Neck: No visible masses     Lungs:   respirations unlabored  Chest Wall:  No tenderness or deformity  Heart:  Regular rate and rhythm,  Abdomen:   Soft, non-tender,         Extremities: RIGHT RING FINGER: SKIN INTACT FINGERS WARM WELL PERFUSED ABLE TO EXTEND THUMB AND DIGITS DORSAL SOFT TISSUE MASSES OVER RING FINGER AT PIP AND DIP JOINT  Pulses: 2+ and symmetric  Skin: Skin color, texture, turgor normal, no rashes or lesions     Neurologic: Normal    Assessment/Plan RIGHT RING FINGER MASSES  RIGHT RING FINGER DEEP MASS EXCISION  R/B/A DISCUSSED WITH PT IN OFFICE.  PT VOICED UNDERSTANDING OF PLAN CONSENT SIGNED DAY OF SURGERY PT SEEN AND EXAMINED PRIOR TO OPERATIVE PROCEDURE/DAY OF SURGERY SITE MARKED. QUESTIONS ANSWERED WILL GO HOME FOLLOWING SURGERY WE ARE PLANNING SURGERY FOR YOUR UPPER EXTREMITY. THE RISKS AND BENEFITS OF SURGERY INCLUDE BUT NOT LIMITED TO BLEEDING INFECTION, DAMAGE TO NEARBY NERVES ARTERIES TENDONS, FAILURE OF SURGERY TO ACCOMPLISH ITS INTENDED GOALS, PERSISTENT SYMPTOMS AND NEED  FOR FURTHER SURGICAL INTERVENTION. WITH THIS IN MIND WE WILL PROCEED. I HAVE DISCUSSED WITH THE PATIENT THE PRE AND POSTOPERATIVE REGIMEN AND THE DOS AND DON'TS. PT VOICED UNDERSTANDING AND INFORMED CONSENT SIGNED.  [name] 07/16/2015, 1:15 PM

## 2015-07-16 NOTE — Discharge Instructions (Addendum)
KEEP BANDAGE CLEAN AND DRY CALL OFFICE FOR F/U APPT [phone] IN 11 DAYS KEEP HAND ELEVATED ABOVE HEART OK TO APPLY ICE TO OPERATIVE AREA CONTACT OFFICE IF ANY WORSENING PAIN OR CONCERNS.  Call your surgeon if you experience:   1.  Fever over 101.0. 2.  Inability to urinate. 3.  Nausea and/or vomiting. 4.  Extreme swelling or bruising at the surgical site. 5.  Continued bleeding from the incision. 6.  Increased pain, redness or drainage from the incision. 7.  Problems related to your pain medication. 8. Any change in color, movement and/or sensation 9. Any problems and/or concerns  Post Anesthesia Home Care Instructions  Activity: Get plenty of rest for the remainder of the day. A responsible adult should stay with you for 24 hours following the procedure.  For the next 24 hours, DO NOT: -Drive a car -Advertising copywriter -Drink alcoholic beverages -Take any medication unless instructed by your physician -Make any legal decisions or sign important papers.  Meals: Start with liquid foods such as gelatin or soup. Progress to regular foods as tolerated. Avoid greasy, spicy, heavy foods. If nausea and/or vomiting occur, drink only clear liquids until the nausea and/or vomiting subsides. Call your physician if vomiting continues.  Special Instructions/Symptoms: Your throat may feel dry or sore from the anesthesia or the breathing tube placed in your throat during surgery. If this causes discomfort, gargle with warm salt water. The discomfort should disappear within 24 hours.  If you had a scopolamine patch placed behind your ear for the management of post- operative nausea and/or vomiting:  1. The medication in the patch is effective for 72 hours, after which it should be removed.  Wrap patch in a tissue and discard in the trash. Wash hands thoroughly with soap and water. 2. You may remove the patch earlier than 72 hours if you experience unpleasant side effects which may include dry  mouth, dizziness or visual disturbances. 3. Avoid touching the patch. Wash your hands with soap and water after contact with the patch.

## 2015-07-17 ENCOUNTER — Encounter (HOSPITAL_BASED_OUTPATIENT_CLINIC_OR_DEPARTMENT_OTHER): Payer: Self-pay | Admitting: Orthopedic Surgery

## 2015-07-17 LAB — HEPATITIS C ANTIBODY (REFLEX)

## 2015-07-17 LAB — HCV COMMENT:

## 2015-07-17 LAB — HEPATITIS B SURFACE ANTIGEN: HEP B S AG: NEGATIVE

## 2015-07-17 NOTE — Op Note (Signed)
NAME:  Wanda Crawford, Wanda Crawford NO.:  [id number]  MEDICAL RECORD NO.:  [id number]  LOCATION:                               FACILITY:  [facility]  PHYSICIAN:  [name] IV, M.D.DATE OF BIRTH:  13-Dec-1975  DATE OF PROCEDURE:  07/16/2015 DATE OF DISCHARGE:  07/16/2015                              OPERATIVE REPORT   PREOPERATIVE DIAGNOSES: 1. Right ring finger and dorsal soft tissue mass, proximal     interphalangeal joint. 2. Right ring finger and dorsal soft tissue mass, distal     interphalangeal joint.  POSTOPERATIVE DIAGNOSES: 1. Right ring finger and dorsal soft tissue mass, proximal     interphalangeal joint. 2. Right ring finger and dorsal soft tissue mass, distal     interphalangeal joint.  ATTENDING PHYSICIAN:  [name], M.D. who has scrubbed and was present for the entire procedure.  ASSISTANT SURGEON:  None.  ANESTHESIA:  General with 1% Xylocaine and 0.25% Marcaine local block with IV sedation.  SURGICAL PROCEDURES: 1. Right ring finger deep mass excision greater than 2 cm dorsal     aspect of the proximal interphalangeal joint. 2. Right ring finger proximal interphalangeal joint arthrotomy,     exploration and debridement. 3. Right ring finger distal interphalangeal joint excision of deep     mass less than 1.5 cm. 4. Right ring finger distal interphalangeal joint arthrotomy,     exploration and debridement.  SURGICAL SPECIMENS:  Proximal interphalangeal joint and distal interphalangeal joint mass to Pathology.  SURGICAL INDICATIONS:  [name] is a right-hand-dominant female with persistent masses over the PIP and DIP joints of the fingers.  The patient elected to undergo the above procedure.  Risks, benefits, and alternatives were discussed in detail with the patient.  Signed informed consent was obtained.  Risks include, but not limited to bleeding; infection; damage to nearby nerves, arteries, or tendons; loss of motion of the  wrist and digits; incomplete relief of symptoms and need for further surgical intervention.  DESCRIPTION OF PROCEDURE:  The patient was properly identified in the preop holding area, marked with a permanent marker made on the right ring finger to indicate the correct operative site.  The patient was brought back to the operating room and placed supine on the anesthesia room table where the IV sedation was administered.  The patient tolerated this well.  A well-padded tourniquet was placed on the right forearm and sealed with 1000-drape.  Right upper extremity was then prepped and draped in normal sterile fashion.  Time-out was called, the correct site was identified and procedure was then begun.  The local anesthetic was administered.  Attention was then turned to the dorsal aspect of the ring finger.  Longitudinal incision was made directly over the PIP joint.  Dissection was carried down through the skin and subcutaneous tissues deep to the extensor mechanism.  The extensor mechanism was incised longitudinally over the mass, which was then countered.  The mass was then circumferentially dissected and removed. There were two separate masses arising from the PIP joint.  The PIP joint was opened.  Arthrotomy was then carried out and capsulectomy was then carried out of the joint.  The  tissue was then sent to Pathology. The joint cartilage looked good.  The wound was then thoroughly irrigated.  The extensor mechanism was then closed with 3-0 Ethibond and skin closed with 4-0 Prolene sutures.  This was a greater than 2 cm mass and had actually two separate areas both arising from the joint. Attention was then turned to the distal interphalangeal joint with similar technique was then used going to slightly on the ulnar margin. Dissection carried all the way down to the joint.  The extensor tendon was then carefully protected.  The interval was then incised longitudinally.  The mass was then  circumferentially dissected and removed.  The joint arthrotomy was then carried out and debridement of the capsule was then done.  This specimen was then sent to Pathology. The wound was then irrigated and skin was then closed using horizontal mattress Prolene sutures.  Adaptic dressing and sterile compressive bandage were then applied.  Tourniquet was deflated.  Hemostasis was obtained and the patient was placed in a small compressive bandage.  The patient was taken to the recovery room in good condition.  POSTPROCEDURAL PLAN:  The patient will be discharged to home, seen back in the office in approximately 12 days for wound check, go over to Pathology both the distal and proximal masses.  Specimen was sent, and gradual use and activity depending on the pathology.     Madelynn Done, M.D.     FWO/MEDQ  D:  07/16/2015  T:  07/17/2015  Job:  [id number]

## 2015-12-24 ENCOUNTER — Encounter: Payer: Self-pay | Admitting: Medical

## 2015-12-24 ENCOUNTER — Ambulatory Visit (INDEPENDENT_AMBULATORY_CARE_PROVIDER_SITE_OTHER): Payer: BLUE CROSS/BLUE SHIELD | Admitting: Medical

## 2015-12-24 VITALS — BP 112/76 | HR 90 | Temp 98.1°F | Ht 67.0 in | Wt 188.0 lb

## 2015-12-24 DIAGNOSIS — R6883 Chills (without fever): Secondary | ICD-10-CM | POA: Diagnosis not present

## 2015-12-24 DIAGNOSIS — J111 Influenza due to unidentified influenza virus with other respiratory manifestations: Secondary | ICD-10-CM | POA: Diagnosis not present

## 2015-12-24 DIAGNOSIS — J029 Acute pharyngitis, unspecified: Secondary | ICD-10-CM | POA: Diagnosis not present

## 2015-12-24 DIAGNOSIS — H6691 Otitis media, unspecified, right ear: Secondary | ICD-10-CM | POA: Diagnosis not present

## 2015-12-24 MED ORDER — AZITHROMYCIN 250 MG PO TABS: ORAL_TABLET | ORAL | Status: DC

## 2015-12-24 MED ORDER — FLUTICASONE PROPIONATE 50 MCG/ACT NA SUSP: 2.0000 | Freq: Every day | NASAL | Status: DC

## 2015-12-24 MED ORDER — OSELTAMIVIR PHOSPHATE 75 MG PO CAPS: 75.0000 mg | ORAL_CAPSULE | Freq: Two times a day (BID) | ORAL | Status: DC

## 2015-12-24 MED ORDER — FLUCONAZOLE 150 MG PO TABS: 150.0000 mg | ORAL_TABLET | Freq: Once | ORAL | Status: DC

## 2015-12-24 MED ORDER — BENZONATATE 100 MG PO CAPS: 100.0000 mg | ORAL_CAPSULE | Freq: Three times a day (TID) | ORAL | Status: DC | PRN

## 2015-12-24 NOTE — Patient Instructions (Addendum)
Will treat you clinically for flu syndrome and secondary rt ear infection.  Tamiflu for flu. Azithromycin for ear infection. For nasal congestion flonase. For cough benzonatate. For any yeast infection with antibioitic then diflucan.  Follow up in 7 days or as needed

## 2015-12-24 NOTE — Progress Notes (Signed)
Pre visit review using our clinic review tool, if applicable. No additional management support is needed unless otherwise documented below in the visit note. 

## 2015-12-24 NOTE — Progress Notes (Signed)
Subjective:    Patient ID: Wanda Crawford, female    DOB: [birth year], 40 y.o.   MRN: [id number]  HPI   Pt in states that on Monday she had clear runny nose, and now nasal congestion. She feels fatigue, fever, chills, fatigue, diffuse  bodyaches and mild st as well(past 2 days). Pt works in nursing home.   Review of Systems  Constitutional: Positive for fever and fatigue. Negative for chills.  HENT: Positive for congestion, ear pain and rhinorrhea.   Respiratory: Positive for cough. Negative for chest tightness, shortness of breath and wheezing.   Cardiovascular: Negative for chest pain and palpitations.  Gastrointestinal: Negative for abdominal pain.  Musculoskeletal: Positive for myalgias.  Neurological: Negative for dizziness and headaches.   Past Medical History  Diagnosis Date  . Sickle cell trait (HCC)   . History of abnormal cervical Pap smear     2011  . Mass of finger of right hand     ring    Social History   Social History  . Marital Status: Married    Spouse Name: N/A  . Number of Children: N/A  . Years of Education: N/A   Occupational History  . Not on file.   Social History Main Topics  . Smoking status: Former Smoker -- 3 years    Types: Cigarettes    Quit date: 07/09/2006  . Smokeless tobacco: Never Used  . Alcohol Use: Yes  . Drug Use: No  . Sexual Activity:    Partners: Male    Birth Control/ Protection: Pill     Comment: portia   Other Topics Concern  . Not on file   Social History Narrative    Past Surgical History  Procedure Laterality Date  . Laparoscopic ovarian cystectomy Bilateral dec 2006  . Mass excision N/A 07/16/2015    Procedure: RIGHT RING FINGER DEEP MASS EXCISION X 2;  Surgeon: Bradly Bienenstock, MD;  Location: [facility];  Service: Orthopedics;  Laterality: N/A;    Family History  Problem Relation Age of Onset  . Cancer Maternal Grandmother     breast  . Cancer Mother 61    breast   . Heart disease  Mother 35    chf    Allergies  Allergen Reactions  . Penicillins Hives    Current Outpatient Prescriptions on File Prior to Visit  Medication Sig Dispense Refill  . norethindrone-ethinyl estradiol (CYCLAFEM,ALYACEN) 0.5/0.75/1-35 MG-MCG tablet Take 1 tablet by mouth every evening.      No current facility-administered medications on file prior to visit.    BP 112/76 mmHg  Pulse 90  Temp(Src) 98.1 F (36.7 C) (Oral)  Ht  (1.702 m)  Wt 188 lb (85.276 kg)  BMI 29.44 kg/m2  SpO2 98%  LMP 12/03/2015       Objective:   Physical Exam  General  Mental Status - Alert. General Appearance - Well groomed. Not in acute distress.  Skin Rashes- No Rashes.  HEENT Head- Normal. Ear Auditory Canal - Left- Normal. Right - Normal.Tympanic Membrane- Left- Normal. Right- rt tim red Eye Sclera/Conjunctiva- Left- Normal. Right- Normal. Nose & Sinuses Nasal Mucosa- Left-  Boggy and Congested. Right-  Boggy and  Congested.Bilateral maxillary and frontal sinus pressure. Mouth & Throat Lips: Upper Lip- Normal: no dryness, cracking, pallor, cyanosis, or vesicular eruption. Lower Lip-Normal: no dryness, cracking, pallor, cyanosis or vesicular eruption. Buccal Mucosa- Bilateral- No Aphthous ulcers. Oropharynx- No Discharge or Erythema. Tonsils: Characteristics- Bilateral- No Erythema or Congestion. Size/Enlargement-  Bilateral- No enlargement. Discharge- bilateral-None.  Neck Neck- Supple. No Masses.   Chest and Lung Exam Auscultation: Breath Sounds:-Clear even and unlabored.  Cardiovascular Auscultation:Rythm- Regular, rate and rhythm. Murmurs & Other Heart Sounds:Ausculatation of the heart reveal- No Murmurs.  Lymphatic Head & Neck General Head & Neck Lymphatics: Bilateral: Description- No Localized lymphadenopathy.       Assessment & Plan:  Will treat you clinically for flu syndrome and secondary rt ear infection.(discussed with pt getting flu test but she expressed in  some hurry. And I explained to her with her symptoms can treat based on clinical presentation. Pt comfortable with this/expressed understanding)  Tamiflu for flu. Azithromycin for ear infection. For nasal congestion flonase. For cough benzonatate. For any yeast infection with antibioitic then diflucan.  Follow up in 7 days or as needed

## 2016-07-12 ENCOUNTER — Encounter: Payer: Self-pay | Admitting: Family Medicine

## 2016-07-12 ENCOUNTER — Ambulatory Visit (INDEPENDENT_AMBULATORY_CARE_PROVIDER_SITE_OTHER): Payer: BLUE CROSS/BLUE SHIELD | Admitting: Family Medicine

## 2016-07-12 VITALS — BP 120/74 | HR 81 | Temp 98.9°F | Ht 68.0 in | Wt 191.1 lb

## 2016-07-12 DIAGNOSIS — M545 Low back pain, unspecified: Secondary | ICD-10-CM

## 2016-07-12 DIAGNOSIS — R319 Hematuria, unspecified: Secondary | ICD-10-CM | POA: Diagnosis not present

## 2016-07-12 DIAGNOSIS — G4700 Insomnia, unspecified: Secondary | ICD-10-CM

## 2016-07-12 LAB — POCT URINALYSIS DIPSTICK
Bilirubin, UA: NEGATIVE
Blood, UA: POSITIVE
GLUCOSE UA: NEGATIVE
Ketones, UA: NEGATIVE
LEUKOCYTES UA: NEGATIVE
NITRITE UA: NEGATIVE
Protein, UA: NEGATIVE
SPEC GRAV UA: 1.015
UROBILINOGEN UA: 2
pH, UA: 6

## 2016-07-12 MED ORDER — ALPRAZOLAM 0.5 MG PO TABS: 0.5000 mg | ORAL_TABLET | Freq: Every evening | ORAL | 0 refills | Status: DC | PRN

## 2016-07-12 MED ORDER — MELOXICAM 7.5 MG PO TABS: 7.5000 mg | ORAL_TABLET | Freq: Every day | ORAL | 0 refills | Status: DC

## 2016-07-12 MED ORDER — CYCLOBENZAPRINE HCL 10 MG PO TABS: 10.0000 mg | ORAL_TABLET | Freq: Three times a day (TID) | ORAL | 0 refills | Status: DC | PRN

## 2016-07-12 NOTE — Progress Notes (Signed)
Pre visit review using our clinic review tool, if applicable. No additional management support is needed unless otherwise documented below in the visit note. 

## 2016-07-12 NOTE — Progress Notes (Signed)
Patient ID: Wanda MeyerKenisha Crawford, female    DOB: 1976/06/06  Age: 40 y.o. MRN: [id number]    Subjective:  Subjective  HPI Wanda Crawford presents for low back pain x few weeks.  No known injury.  Pt thinks its her mattress.  She has only had it maybe 3 years and it was inexpensive.  No radiation down her leg.  No dysuria, frequency.     Review of Systems  Constitutional: Negative for appetite change, diaphoresis, fatigue and unexpected weight change.  Eyes: Negative for pain, redness and visual disturbance.  Respiratory: Negative for cough, chest tightness, shortness of breath and wheezing.   Cardiovascular: Negative for chest pain, palpitations and leg swelling.  Endocrine: Negative for cold intolerance, heat intolerance, polydipsia, polyphagia and polyuria.  Genitourinary: Negative for difficulty urinating, dysuria and frequency.  Musculoskeletal: Positive for back pain. Negative for gait problem, joint swelling, neck pain and neck stiffness.  Neurological: Negative for dizziness, light-headedness, numbness and headaches.    History Past Medical History:  Diagnosis Date  . History of abnormal cervical Pap smear    2011  . Mass of finger of right hand    ring  . Sickle cell trait (HCC)     She has a past surgical history that includes Laparoscopic ovarian cystectomy (Bilateral, dec 2006) and Mass excision (N/A, 07/16/2015).   Her family history includes Cancer in her maternal grandmother; Cancer (age of onset: 10542) in her mother; Heart disease (age of onset: 562) in her mother.She reports that she quit smoking about 10 years ago. Her smoking use included Cigarettes. She quit after 3.00 years of use. She has never used smokeless tobacco. She reports that she drinks alcohol. She reports that she does not use drugs.  Current Outpatient Prescriptions on File Prior to Visit  Medication Sig Dispense Refill  . norethindrone-ethinyl estradiol (CYCLAFEM,ALYACEN) 0.5/0.75/1-35 MG-MCG tablet Take 1  tablet by mouth every evening.      No current facility-administered medications on file prior to visit.      Objective:  Objective  Physical Exam  Constitutional: She is oriented to person, place, and time. She appears well-developed and well-nourished.  HENT:  Head: Normocephalic and atraumatic.  Eyes: Conjunctivae and EOM are normal.  Neck: Normal range of motion. Neck supple. No JVD present. Carotid bruit is not present. No thyromegaly present.  Cardiovascular: Normal rate, regular rhythm and normal heart sounds.   No murmur heard. Pulmonary/Chest: Effort normal and breath sounds normal. No respiratory distress. She has no wheezes. She has no rales. She exhibits no tenderness.  Musculoskeletal: She exhibits no edema.  Neurological: She is alert and oriented to person, place, and time. She has normal strength. No sensory deficit. Coordination and gait normal.  Pain with raising legs b/l but she has full rom Good strength both low ext    Psychiatric: She has a normal mood and affect.  Nursing note and vitals reviewed.  BP 120/74 (BP Location: Left Arm, Patient Position: Sitting, Cuff Size: Normal)   Pulse 81   Temp 98.9 F (37.2 C) (Oral)   Ht 5\' 8"  (1.727 m)   Wt 191 lb 2 oz (86.7 kg)   BMI 29.06 kg/m  Wt Readings from Last 3 Encounters:  07/12/16 191 lb 2 oz (86.7 kg)  12/24/15 188 lb (85.3 kg)  07/16/15 181 lb (82.1 kg)     Lab Results  Component Value Date   HGB 11.5 (L) 07/16/2015    No results found.   Assessment & Plan:  Plan  I have discontinued [name] oseltamivir, azithromycin, fluticasone, benzonatate, and fluconazole. I am also having her start on ALPRAZolam, cyclobenzaprine, and meloxicam. Additionally, I am having her maintain her norethindrone-ethinyl estradiol.  Meds ordered this encounter  Medications  . ALPRAZolam (XANAX) 0.5 MG tablet    Sig: Take 1 tablet (0.5 mg total) by mouth at bedtime as needed for anxiety.    Dispense:  30 tablet     Refill:  0  . cyclobenzaprine (FLEXERIL) 10 MG tablet    Sig: Take 1 tablet (10 mg total) by mouth 3 (three) times daily as needed for muscle spasms.    Dispense:  30 tablet    Refill:  0  . meloxicam (MOBIC) 7.5 MG tablet    Sig: Take 1 tablet (7.5 mg total) by mouth daily.    Dispense:  30 tablet    Refill:  0    Problem List Items Addressed This Visit    None    Visit Diagnoses    Bilateral low back pain without sciatica    -  Primary   Relevant Medications   cyclobenzaprine (FLEXERIL) 10 MG tablet   meloxicam (MOBIC) 7.5 MG tablet   Other Relevant Orders   POCT Urinalysis Dipstick (Completed)   CULTURE, URINE COMPREHENSIVE   Hematuria       Relevant Orders   CULTURE, URINE COMPREHENSIVE   Insomnia       Relevant Medications   ALPRAZolam (XANAX) 0.5 MG tablet      Follow-up: Return if symptoms worsen or fail to improve.  Donato Schultz, DO

## 2016-07-12 NOTE — Patient Instructions (Signed)

## 2016-07-14 LAB — CULTURE, URINE COMPREHENSIVE: Organism ID, Bacteria: NO GROWTH

## 2016-07-27 LAB — HM PAP SMEAR: HM Pap smear: NORMAL

## 2016-08-01 ENCOUNTER — Encounter: Payer: Self-pay | Admitting: Medical

## 2016-08-19 ENCOUNTER — Other Ambulatory Visit: Payer: Self-pay

## 2016-08-19 DIAGNOSIS — M545 Low back pain, unspecified: Secondary | ICD-10-CM

## 2016-08-19 MED ORDER — MELOXICAM 7.5 MG PO TABS: 7.5000 mg | ORAL_TABLET | Freq: Every day | ORAL | 0 refills | Status: DC

## 2016-10-11 ENCOUNTER — Other Ambulatory Visit: Payer: Self-pay | Admitting: Medical

## 2016-10-11 DIAGNOSIS — M545 Low back pain, unspecified: Secondary | ICD-10-CM

## 2016-10-27 ENCOUNTER — Ambulatory Visit: Admit: 2016-10-27 | Payer: BLUE CROSS/BLUE SHIELD | Admitting: Obstetrics and Gynecology

## 2016-10-27 SURGERY — DILATATION & CURETTAGE/HYSTEROSCOPY WITH RESECTOCOPE
Anesthesia: Choice

## 2016-11-14 ENCOUNTER — Other Ambulatory Visit: Payer: Self-pay | Admitting: Medical

## 2016-11-14 DIAGNOSIS — M545 Low back pain, unspecified: Secondary | ICD-10-CM

## 2016-12-19 ENCOUNTER — Other Ambulatory Visit: Payer: Self-pay | Admitting: Medical

## 2016-12-19 DIAGNOSIS — M545 Low back pain, unspecified: Secondary | ICD-10-CM

## 2017-01-02 DIAGNOSIS — Z803 Family history of malignant neoplasm of breast: Secondary | ICD-10-CM | POA: Diagnosis not present

## 2017-02-15 DIAGNOSIS — J309 Allergic rhinitis, unspecified: Secondary | ICD-10-CM | POA: Diagnosis not present

## 2017-07-15 ENCOUNTER — Other Ambulatory Visit: Payer: Self-pay | Admitting: Medical

## 2017-07-15 DIAGNOSIS — M545 Low back pain, unspecified: Secondary | ICD-10-CM

## 2017-07-18 DIAGNOSIS — Z3040 Encounter for surveillance of contraceptives, unspecified: Secondary | ICD-10-CM | POA: Diagnosis not present

## 2017-07-18 DIAGNOSIS — Z01419 Encounter for gynecological examination (general) (routine) without abnormal findings: Secondary | ICD-10-CM | POA: Diagnosis not present

## 2017-07-18 DIAGNOSIS — Z6829 Body mass index (BMI) 29.0-29.9, adult: Secondary | ICD-10-CM | POA: Diagnosis not present

## 2017-07-18 DIAGNOSIS — A609 Anogenital herpesviral infection, unspecified: Secondary | ICD-10-CM | POA: Diagnosis not present

## 2017-07-18 DIAGNOSIS — Z124 Encounter for screening for malignant neoplasm of cervix: Secondary | ICD-10-CM | POA: Diagnosis not present

## 2017-09-10 ENCOUNTER — Other Ambulatory Visit: Payer: Self-pay | Admitting: Medical

## 2017-09-10 DIAGNOSIS — M545 Low back pain, unspecified: Secondary | ICD-10-CM

## 2017-09-13 NOTE — Telephone Encounter (Signed)
Patient scheduled with PCP for [facility] [place] 09/21/2017

## 2017-09-13 NOTE — Telephone Encounter (Signed)
Pt due for follow up please call and schedule appointment.  

## 2017-09-19 DIAGNOSIS — R319 Hematuria, unspecified: Secondary | ICD-10-CM | POA: Diagnosis not present

## 2017-09-19 DIAGNOSIS — N760 Acute vaginitis: Secondary | ICD-10-CM | POA: Diagnosis not present

## 2017-09-19 DIAGNOSIS — Z113 Encounter for screening for infections with a predominantly sexual mode of transmission: Secondary | ICD-10-CM | POA: Diagnosis not present

## 2017-09-19 DIAGNOSIS — N898 Other specified noninflammatory disorders of vagina: Secondary | ICD-10-CM | POA: Diagnosis not present

## 2017-09-21 ENCOUNTER — Ambulatory Visit: Payer: Self-pay | Admitting: Medical

## 2018-05-15 DIAGNOSIS — L732 Hidradenitis suppurativa: Secondary | ICD-10-CM | POA: Diagnosis not present

## 2018-05-15 DIAGNOSIS — L293 Anogenital pruritus, unspecified: Secondary | ICD-10-CM | POA: Diagnosis not present

## 2018-05-15 DIAGNOSIS — T753XXA Motion sickness, initial encounter: Secondary | ICD-10-CM | POA: Diagnosis not present

## 2018-05-15 DIAGNOSIS — B009 Herpesviral infection, unspecified: Secondary | ICD-10-CM | POA: Diagnosis not present

## 2018-06-01 DIAGNOSIS — N898 Other specified noninflammatory disorders of vagina: Secondary | ICD-10-CM | POA: Diagnosis not present

## 2018-06-01 DIAGNOSIS — A599 Trichomoniasis, unspecified: Secondary | ICD-10-CM | POA: Diagnosis not present

## 2018-06-01 DIAGNOSIS — A609 Anogenital herpesviral infection, unspecified: Secondary | ICD-10-CM | POA: Diagnosis not present

## 2018-06-29 DIAGNOSIS — B373 Candidiasis of vulva and vagina: Secondary | ICD-10-CM | POA: Diagnosis not present

## 2018-06-29 DIAGNOSIS — B009 Herpesviral infection, unspecified: Secondary | ICD-10-CM | POA: Diagnosis not present

## 2018-06-29 DIAGNOSIS — R102 Pelvic and perineal pain: Secondary | ICD-10-CM | POA: Diagnosis not present

## 2018-06-29 DIAGNOSIS — N898 Other specified noninflammatory disorders of vagina: Secondary | ICD-10-CM | POA: Diagnosis not present

## 2018-07-19 DIAGNOSIS — N898 Other specified noninflammatory disorders of vagina: Secondary | ICD-10-CM | POA: Diagnosis not present

## 2018-07-19 DIAGNOSIS — Z1231 Encounter for screening mammogram for malignant neoplasm of breast: Secondary | ICD-10-CM | POA: Diagnosis not present

## 2018-07-19 DIAGNOSIS — R87610 Atypical squamous cells of undetermined significance on cytologic smear of cervix (ASC-US): Secondary | ICD-10-CM | POA: Diagnosis not present

## 2018-07-19 DIAGNOSIS — Z6829 Body mass index (BMI) 29.0-29.9, adult: Secondary | ICD-10-CM | POA: Diagnosis not present

## 2018-07-19 DIAGNOSIS — Z01411 Encounter for gynecological examination (general) (routine) with abnormal findings: Secondary | ICD-10-CM | POA: Diagnosis not present

## 2018-07-19 DIAGNOSIS — N912 Amenorrhea, unspecified: Secondary | ICD-10-CM | POA: Diagnosis not present

## 2018-07-19 DIAGNOSIS — Z124 Encounter for screening for malignant neoplasm of cervix: Secondary | ICD-10-CM | POA: Diagnosis not present

## 2018-07-31 ENCOUNTER — Encounter (HOSPITAL_BASED_OUTPATIENT_CLINIC_OR_DEPARTMENT_OTHER): Payer: Self-pay | Admitting: *Deleted

## 2018-07-31 ENCOUNTER — Other Ambulatory Visit: Payer: Self-pay

## 2018-07-31 ENCOUNTER — Emergency Department (HOSPITAL_BASED_OUTPATIENT_CLINIC_OR_DEPARTMENT_OTHER): Payer: BLUE CROSS/BLUE SHIELD

## 2018-07-31 ENCOUNTER — Emergency Department (HOSPITAL_BASED_OUTPATIENT_CLINIC_OR_DEPARTMENT_OTHER)
Admission: EM | Admit: 2018-07-31 | Discharge: 2018-07-31 | Disposition: A | Discharge status: Home / Self Care | Payer: BLUE CROSS/BLUE SHIELD | Attending: Emergency Medicine | Admitting: Emergency Medicine

## 2018-07-31 DIAGNOSIS — Z87891 Personal history of nicotine dependence: Secondary | ICD-10-CM | POA: Insufficient documentation

## 2018-07-31 DIAGNOSIS — N3000 Acute cystitis without hematuria: Secondary | ICD-10-CM

## 2018-07-31 DIAGNOSIS — A599 Trichomoniasis, unspecified: Secondary | ICD-10-CM | POA: Diagnosis not present

## 2018-07-31 DIAGNOSIS — Z79899 Other long term (current) drug therapy: Secondary | ICD-10-CM | POA: Diagnosis not present

## 2018-07-31 DIAGNOSIS — S46911A Strain of unspecified muscle, fascia and tendon at shoulder and upper arm level, right arm, initial encounter: Secondary | ICD-10-CM | POA: Diagnosis not present

## 2018-07-31 DIAGNOSIS — R0602 Shortness of breath: Secondary | ICD-10-CM | POA: Diagnosis not present

## 2018-07-31 DIAGNOSIS — M546 Pain in thoracic spine: Secondary | ICD-10-CM | POA: Diagnosis not present

## 2018-07-31 DIAGNOSIS — T148XXA Other injury of unspecified body region, initial encounter: Secondary | ICD-10-CM

## 2018-07-31 LAB — URINALYSIS, MICROSCOPIC (REFLEX)

## 2018-07-31 LAB — URINALYSIS, ROUTINE W REFLEX MICROSCOPIC
Bilirubin Urine: NEGATIVE
GLUCOSE, UA: NEGATIVE mg/dL
KETONES UR: NEGATIVE mg/dL
Nitrite: NEGATIVE
PH: 6 (ref 5.0–8.0)
Protein, ur: NEGATIVE mg/dL
Specific Gravity, Urine: 1.02 (ref 1.005–1.030)

## 2018-07-31 LAB — PREGNANCY, URINE: Preg Test, Ur: NEGATIVE

## 2018-07-31 MED ORDER — ORPHENADRINE CITRATE ER 100 MG PO TB12: 100.0000 mg | ORAL_TABLET | Freq: Two times a day (BID) | ORAL | 0 refills | Status: DC

## 2018-07-31 MED ORDER — IBUPROFEN 800 MG PO TABS: 800.0000 mg | ORAL_TABLET | Freq: Three times a day (TID) | ORAL | 0 refills | Status: DC

## 2018-07-31 MED ORDER — SULFAMETHOXAZOLE-TRIMETHOPRIM 800-160 MG PO TABS: 1.0000 | ORAL_TABLET | Freq: Two times a day (BID) | ORAL | 0 refills | Status: DC

## 2018-07-31 MED ORDER — KETOROLAC TROMETHAMINE 30 MG/ML IJ SOLN
30.0000 mg | Freq: Once | INTRAMUSCULAR | Status: AC
  Administered 2018-07-31: 30 mg via INTRAVENOUS
  Filled 2018-07-31: qty 1

## 2018-07-31 MED ORDER — METRONIDAZOLE 500 MG PO TABS: 500.0000 mg | ORAL_TABLET | Freq: Two times a day (BID) | ORAL | 0 refills | Status: DC

## 2018-07-31 NOTE — ED Notes (Signed)
Pt made aware provider was coming to speak with plan. Pt refused to stay.

## 2018-07-31 NOTE — ED Notes (Signed)
Pt stating has to leave to pick up daughter. Pain "better"IV d/c'd Provider informed of patient wanting to leave.

## 2018-07-31 NOTE — ED Notes (Signed)
Patient transported to X-ray 

## 2018-07-31 NOTE — Discharge Instructions (Signed)
1.  You need to schedule follow-up with your gynecologist.  You will need further evaluation for ongoing pelvic infection. 2.  Schedule appointment with your family doctor to see if your back pain is improving.  Return to the emergency department if you are getting shortness of breath, worsening pain, cough with sputum, fever or other concerns.  Take ibuprofen 800 mg 3 times a day with food.  Take Norflex twice a day.

## 2018-07-31 NOTE — ED Provider Notes (Signed)
[facility] EMERGENCY DEPARTMENT Provider Note   CSN: [id number] Arrival date & time: 07/31/18  1733     History   Chief Complaint Chief Complaint  Patient presents with  . Back Pain    HPI Wanda Crawford is a 42 y.o. female.  HPI Patient reports that she has had a pain that she developed in her right shoulder blade area.  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  She does not lift or move heavy objects.  She reports the pain became very intense and started to ache and feel painful into her shoulder and her arm.  This morning when she got up she had tingling in the arm as well.  She has a coworker who does massage and she tried to massage therapy which temporarily was helpful but the pain persisted.  She has tried ibuprofen with only slight relief.  She reports it hurts so much that it makes her feel short of breath.  She reports if she moves her arm or shoulder anyway it can make the pain much worse.  She was treated for trichomonas several weeks ago and did not think it completely went away.  She had been contacting her doctor trying to get a follow-up regarding that problem.  She denies she is having any problems with abdominal pain or vaginal bleeding. Past Medical History:  Diagnosis Date  . History of abnormal cervical Pap smear    2011  . Mass of finger of right hand    ring  . Sickle cell trait [facility](HCC)     Patient Active Problem List   Diagnosis Date Noted  . Migraine 04/23/2015  . H/O sickle cell trait 04/23/2015  . Allergic rhinitis 04/23/2015  . Skin lesion 04/23/2015  . Ganglion cyst 04/23/2015  . Blood in the urine 04/30/2013  . Anemia, iron deficiency 04/30/2013    Past Surgical History:  Procedure Laterality Date  . LAPAROSCOPIC OVARIAN CYSTECTOMY Bilateral dec 2006  . MASS EXCISION N/A 07/16/2015   Procedure: RIGHT RING FINGER DEEP MASS EXCISION X 2;  Surgeon: Bradly BienenstockFred Ortmann, MD;  [facility];  Service: Orthopedics;   Laterality: N/A;     OB History    Gravida  1   Para  1   Term  1   Preterm      AB      Living  1     SAB      TAB      Ectopic      Multiple      Live Births               Home Medications    Prior to Admission medications   Medication Sig Start Date End Date Taking? Authorizing Provider  norethindrone-ethinyl estradiol (CYCLAFEM,ALYACEN) 0.5/0.75/1-35 MG-MCG tablet Take 1 tablet by mouth every evening.    Yes [provider]  cyclobenzaprine (FLEXERIL) 10 MG tablet Take 1 tablet (10 mg total) by mouth at bedtime. 08/02/18   Saguier, Ramon DredgeEdward, PA-C  meloxicam (MOBIC) 7.5 MG tablet 1-2 tab po q day 08/02/18   Saguier, Ramon DredgeEdward, PA-C  metroNIDAZOLE (FLAGYL) 500 MG tablet metronidazole 500 mg tablet  Take 1 tablet twice a day by oral route for 7 days.    [provider]    Family History Family History  Problem Relation Age of Onset  . Cancer Mother 3442       breast   . Heart disease Mother 6162  chf  . Cancer Maternal Grandmother        breast    Social History Social History   Tobacco Use  . Smoking status: Former Smoker    Years: 3.00    Types: Cigarettes    Last attempt to quit: 07/09/2006    Years since quitting: 12.0  . Smokeless tobacco: Never Used  Substance Use Topics  . Alcohol use: Yes  . Drug use: No     Allergies   Penicillins   Review of Systems Review of Systems 10 Systems reviewed and are negative for acute change except as noted in the HPI.   Physical Exam Updated Vital Signs BP (!) 140/96 (BP Location: Left Arm)   Pulse 80   Temp 98.3 F (36.8 C) (Oral)   Resp 20   Ht 5\' 8"  (1.727 m)   Wt 84.8 kg   LMP 07/28/2018   SpO2 100%   BMI 28.43 kg/m   Physical Exam  Constitutional: She is oriented to person, place, and time.  Patient is alert and nontoxic.  Clinically well in appearance.  She appears to be in pain.  No respiratory distress.  HENT:  Head: Normocephalic and atraumatic.  Eyes: EOM are  normal.  Neck: Neck supple.  Cardiovascular: Normal rate, regular rhythm, normal heart sounds and intact distal pulses.  Pulmonary/Chest: Effort normal and breath sounds normal.  Abdominal: Soft. She exhibits no distension. There is no tenderness. There is no guarding.  Musculoskeletal:  Patient has severe pain to palpation along the paraspinous muscles on the right is as well as any palpation along the subscapularis.  She also endorses severe pain to palpation along the top of the shoulder.  Range of motion of the shoulder is very reproductive of the patient's pain.  The arm is normal without edema.  Neurovascularly intact.  Strength and grip is intact.  No lower extremity edema or calf swelling.  Lower extremities are nontender to palpation.  Neurological: She is alert and oriented to person, place, and time. She exhibits normal muscle tone. Coordination normal.  Skin: Skin is warm and dry.  Psychiatric: She has a normal mood and affect.     ED Treatments / Results  Labs (all labs ordered are listed, but only abnormal results are displayed) Labs Reviewed  URINALYSIS, ROUTINE W REFLEX MICROSCOPIC - Abnormal; Notable for the following components:      Result Value   APPearance CLOUDY (*)    Hgb urine dipstick MODERATE (*)    Leukocytes, UA LARGE (*)    All other components within normal limits  URINALYSIS, MICROSCOPIC (REFLEX) - Abnormal; Notable for the following components:   Bacteria, UA FEW (*)    Trichomonas, UA PRESENT (*)    All other components within normal limits  WET PREP, GENITAL  PREGNANCY, URINE  GC/CHLAMYDIA PROBE AMP ([place]) NOT AT [facility]    EKG None  Radiology Dg Cervical Spine Complete  Result Date: 08/02/2018 CLINICAL DATA:  Cervicalgia with right-sided radicular symptoms EXAM: CERVICAL SPINE - COMPLETE 4+ VIEW COMPARISON:  January 21, 2009 FINDINGS: Frontal, lateral, open-mouth odontoid, and bilateral oblique views were obtained. There is upper thoracic  levoscoliosis. There is no fracture or spondylolisthesis. Prevertebral soft tissues and predental space regions are normal. There is mild disc space narrowing at C5-6. Other disc spaces appear unremarkable. There are small anterior osteophytes at C4, C5, and C6. There is no appreciable exit foraminal narrowing on the oblique views. There is mild reversal of lordotic curvature. IMPRESSION:  Mild reversal of lordotic curvature, a finding most likely indicative of a degree of muscle spasm. There is mild disc space narrowing at C5-6, a finding not present on previous study. No fracture or spondylolisthesis. Electronically Signed   By: Bretta Bang III M.D.   On: 08/02/2018 14:51   Dg Scapula Right  Result Date: 08/02/2018 CLINICAL DATA:  Pain EXAM: RIGHT SCAPULA - 2+ VIEWS COMPARISON:  None. FINDINGS: Frontal and Y scapular/lateral views obtained. No fracture or dislocation. Joint spaces appear normal. No erosive change. Visualized right lung is clear. IMPRESSION: No fracture or dislocation.  No evident arthropathy. Electronically Signed   By: Bretta Bang III M.D.   On: 08/02/2018 14:49   Dg Hand 2 View Right  Result Date: 08/02/2018 CLINICAL DATA:  Soft tissue swelling EXAM: RIGHT HAND - 2 VIEW COMPARISON:  None. FINDINGS: Frontal and lateral views were obtained. There is soft tissue swelling dorsally. No evident fracture or dislocation. Joint spaces appear normal. No erosive change. IMPRESSION: Soft tissue swelling dorsally. No fracture or dislocation. No evident arthropathy. Electronically Signed   By: Bretta Bang III M.D.   On: 08/02/2018 14:48    Procedures Procedures (including critical care time)  Medications Ordered in ED Medications  ketorolac (TORADOL) 30 MG/ML injection 30 mg (30 mg Intravenous Given 07/31/18 2017)     Initial Impression / Assessment and Plan / ED Course  I have reviewed the triage vital signs and the nursing notes.  Pertinent labs & imaging results that  were available during my care of the patient were reviewed by me and considered in my medical decision making (see chart for details).      Final Clinical Impressions(s) / ED Diagnoses   Final diagnoses:  Trichomoniasis  Muscle strain  Acute right-sided thoracic back pain  Acute cystitis without hematuria   1.  Thoracic back pain appears to be musculoskeletal.  Pain is very reproducible with palpation and with range of motion of the arm.  Patient is clinically well without any signs of infectious etiology.  Low probability for PE.  Will treat with NSAID and muscle relaxer. 2.  Patient has trichomonas in the urine.  Reports that she had been treated for this.  Will repeat treatment with Flagyl.  Patient has a gynecologist that she is working with.  We did discuss doing repeat pelvic exam but patient reports that she must go to pick up her daughter.  Patient has suitable follow-up and this is already being addressed.  She does not have any active symptoms that are contributing to her ED visit. 3.  Urine test positive with many leukocytes.  May be secondary to trichomonas but will opt to add Bactrim for UTI.  Return precautions reviewed, patient has follow-up plan in place. ED Discharge Orders         Ordered    metroNIDAZOLE (FLAGYL) 500 MG tablet  2 times daily,   Status:  Discontinued     07/31/18 2146    ibuprofen (ADVIL,MOTRIN) 800 MG tablet  3 times daily,   Status:  Discontinued     07/31/18 2146    orphenadrine (NORFLEX) 100 MG tablet  2 times daily,   Status:  Discontinued     07/31/18 2146    sulfamethoxazole-trimethoprim (BACTRIM DS,SEPTRA DS) 800-160 MG tablet  2 times daily,   Status:  Discontinued     07/31/18 2146    ibuprofen (ADVIL,MOTRIN) 800 MG tablet  3 times daily,   Status:  Discontinued  07/31/18 2148    sulfamethoxazole-trimethoprim (BACTRIM DS,SEPTRA DS) 800-160 MG tablet  2 times daily,   Status:  Discontinued     07/31/18 2148           Arby Barrette,  MD 08/04/18 [phone]

## 2018-07-31 NOTE — ED Notes (Signed)
Pt texting on phone. No SOB. Lungs clr,

## 2018-07-31 NOTE — ED Triage Notes (Signed)
2 days of right scapula pain. She feels SOB. Woke with tingling in her right arm at 5am. Tingling comes and goes. Limited ROM. Last Ibuprofen was at noon.

## 2018-08-02 ENCOUNTER — Encounter: Payer: Self-pay | Admitting: Medical

## 2018-08-02 ENCOUNTER — Ambulatory Visit (INDEPENDENT_AMBULATORY_CARE_PROVIDER_SITE_OTHER): Payer: BLUE CROSS/BLUE SHIELD | Admitting: Medical

## 2018-08-02 ENCOUNTER — Ambulatory Visit (HOSPITAL_BASED_OUTPATIENT_CLINIC_OR_DEPARTMENT_OTHER)
Admission: RE | Admit: 2018-08-02 | Discharge: 2018-08-02 | Disposition: A | Discharge status: Home / Self Care | Payer: BLUE CROSS/BLUE SHIELD | Source: Ambulatory Visit | Attending: Medical | Admitting: Medical

## 2018-08-02 VITALS — BP 123/74 | HR 88 | Temp 98.5°F | Resp 16 | Ht 68.0 in | Wt 191.2 lb

## 2018-08-02 DIAGNOSIS — R9389 Abnormal findings on diagnostic imaging of other specified body structures: Secondary | ICD-10-CM | POA: Insufficient documentation

## 2018-08-02 DIAGNOSIS — M25511 Pain in right shoulder: Secondary | ICD-10-CM | POA: Diagnosis not present

## 2018-08-02 DIAGNOSIS — M79641 Pain in right hand: Secondary | ICD-10-CM

## 2018-08-02 DIAGNOSIS — M7989 Other specified soft tissue disorders: Secondary | ICD-10-CM | POA: Diagnosis not present

## 2018-08-02 DIAGNOSIS — M542 Cervicalgia: Secondary | ICD-10-CM | POA: Diagnosis not present

## 2018-08-02 DIAGNOSIS — M792 Neuralgia and neuritis, unspecified: Secondary | ICD-10-CM

## 2018-08-02 DIAGNOSIS — R300 Dysuria: Secondary | ICD-10-CM

## 2018-08-02 DIAGNOSIS — M4802 Spinal stenosis, cervical region: Secondary | ICD-10-CM | POA: Diagnosis not present

## 2018-08-02 DIAGNOSIS — M898X1 Other specified disorders of bone, shoulder: Secondary | ICD-10-CM | POA: Insufficient documentation

## 2018-08-02 MED ORDER — MELOXICAM 7.5 MG PO TABS: ORAL_TABLET | ORAL | 0 refills | Status: AC

## 2018-08-02 MED ORDER — CYCLOBENZAPRINE HCL 10 MG PO TABS: 10.0000 mg | ORAL_TABLET | Freq: Every day | ORAL | 0 refills | Status: AC

## 2018-08-02 NOTE — Progress Notes (Signed)
Subjective:    Patient ID: Torah Pinnock, female    DOB: 1976/09/26, 42 y.o.   MRN: [id number]  HPI  Pt in states she still has some pain.I tried to review ED note 2 days ago. That note is not done yet? Pt was dx on her avs with thoracic back pain, trichomonas and uti.  On avs they also instructed that she see gynecoligst for futher evaluation. She was treated with IV clindamycin and flagyl po for trich. Pt has appointment with gyn on Oct 9,2019. The urine looked potentially infected. Culture of urine not done. G and C test pending. Pt did have some burning on urination residual compared to the other day.    Pt update me that her rt scapula was hurting on Monday and then had some pain that was shooting down her rt arm. When she lifts rt arm will feel pain that shoots and tingles pain down her arm. Pt was given ibuprofen for pain. Pt also was given norflex.  Pt also has some rt hand mild swelling and pain distal 4th and 5th metacarpal.   Preg test - 2 days ago negative. 2 weeks ago at gyn was also neg. She is on ocp.  Review of Systems  Constitutional: Negative for chills, fatigue and fever.  Respiratory: Negative for cough, chest tightness and wheezing.   Cardiovascular: Negative for chest pain and palpitations.  Gastrointestinal: Negative for abdominal distention, abdominal pain, constipation, diarrhea, nausea and rectal pain.  Genitourinary: Positive for dysuria. Negative for difficulty urinating, flank pain, frequency, pelvic pain, urgency, vaginal discharge and vaginal pain.  Musculoskeletal: Negative for back pain.       See hpi.  Neurological: Negative for dizziness, speech difficulty, weakness and headaches.  Hematological: Negative for adenopathy. Does not bruise/bleed easily.  Psychiatric/Behavioral: Negative for behavioral problems and confusion.   Past Medical History:  Diagnosis Date  . History of abnormal cervical Pap smear    2011  . Mass of finger of right hand    ring  . Sickle cell trait (HCC)      Social History   Socioeconomic History  . Marital status: Married    Spouse name: Not on file  . Number of children: Not on file  . Years of education: Not on file  . Highest education level: Not on file  Occupational History  . Not on file  Social Needs  . Financial resource strain: Not on file  . Food insecurity:    Worry: Not on file    Inability: Not on file  . Transportation needs:    Medical: Not on file    Non-medical: Not on file  Tobacco Use  . Smoking status: Former Smoker    Years: 3.00    Types: Cigarettes    Last attempt to quit: 07/09/2006    Years since quitting: 12.0  . Smokeless tobacco: Never Used  Substance and Sexual Activity  . Alcohol use: Yes  . Drug use: No  . Sexual activity: Yes    Partners: Male    Birth control/protection: Pill    Comment: portia  Lifestyle  . Physical activity:    Days per week: Not on file    Minutes per session: Not on file  . Stress: Not on file  Relationships  . Social connections:    Talks on phone: Not on file    Gets together: Not on file    Attends religious service: Not on file    Active member of club  or organization: Not on file    Attends meetings of clubs or organizations: Not on file    Relationship status: Not on file  . Intimate partner violence:    Fear of current or ex partner: Not on file    Emotionally abused: Not on file    Physically abused: Not on file    Forced sexual activity: Not on file  Other Topics Concern  . Not on file  Social History Narrative  . Not on file    Past Surgical History:  Procedure Laterality Date  . LAPAROSCOPIC OVARIAN CYSTECTOMY Bilateral dec 2006  . MASS EXCISION N/A 07/16/2015   Procedure: RIGHT RING FINGER DEEP MASS EXCISION X 2;  Surgeon: Bradly Bienenstock, MD;  Location: [facility];  Service: Orthopedics;  Laterality: N/A;    Family History  Problem Relation Age of Onset  . Cancer Mother 45       breast     . Heart disease Mother 33       chf  . Cancer Maternal Grandmother        breast    Allergies  Allergen Reactions  . Penicillins Hives    Current Outpatient Medications on File Prior to Visit  Medication Sig Dispense Refill  . metroNIDAZOLE (FLAGYL) 500 MG tablet metronidazole 500 mg tablet  Take 1 tablet twice a day by oral route for 7 days.    . norethindrone-ethinyl estradiol (CYCLAFEM,ALYACEN) 0.5/0.75/1-35 MG-MCG tablet Take 1 tablet by mouth every evening.      No current facility-administered medications on file prior to visit.     BP 123/74   Pulse 88   Temp 98.5 F (36.9 C) (Oral)   Resp 16   Ht 5\' 8"  (1.727 m)   Wt 191 lb 3.2 oz (86.7 kg)   LMP 07/28/2018   SpO2 100%   BMI 29.07 kg/m       Objective:   Physical Exam  General Mental Status- Alert. General Appearance- Not in acute distress.   Skin General: Color- Normal Color. Moisture- Normal Moisture.  Neck Carotid Arteries- Normal color. Moisture- Normal Moisture. No carotid bruits. No JVD. Rt trapezius pain on palpation  Chest and Lung Exam Auscultation: Breath Sounds:-Normal.  Cardiovascular Auscultation:Rythm- Regular. Murmurs & Other Heart Sounds:Auscultation of the heart reveals- No Murmurs.  Abdomen Inspection:-Inspeection Normal. Palpation/Percussion:Note:No mass. Palpation and Percussion of the abdomen reveal- Non Tender, Non Distended + BS, no rebound or guarding.    Neurologic Cranial Nerve exam:- CN III-XII intact(No nystagmus), symmetric smile. Strength:- 5/5 equal and symmetric strength both upper and lower extremities. Sensation-sharp dull discrimination intact of upper ext/palms/fingers  Rt scapula- mild medial aspect tender to palpation. Rt trapezius mild tender to palpation. Rt upper ext- on lifting arm up some radicular pain described.      Assessment & Plan:  For your recent slight dysuria and trichomonas, recommend that you continue Flagyl and Bactrim.  You have  some residual pain on urination so we will get urine culture as it appears culture was not done in the emergency department.  Keep appointment with gynecologist. If signs/symptoms worsen notify us.  For right upper shin a radicular pain and scapular region pain, I did place xray of cervical spine and scapula. RX meloxicam sent to pharmacy. DC ibuprofen. DC norfex as well. Rx Flexeril.  For rt hand pain will get xray. After xray are back might decide on getting inflammation studies or refer to sports med vs hand specialist.  Follow up in 10-14  days or as needed

## 2018-08-02 NOTE — Patient Instructions (Addendum)
For your recent slight dysuria and trichomonas, recommend that you continue Flagyl and Bactrim.  You have some residual pain on urination so we will get urine culture as it appears culture was not done in the emergency department.  Keep appointment with gynecologist. If signs/symptoms worsen notify us.  For right upper shin a radicular pain and scapular region pain, I did place xray of cervical spine and scapula. RX meloxicam sent to pharmacy. DC ibuprofen. DC norfex as well. Rx Flexeril.  For rt hand pain will get xray. After xray are back might decide on getting inflammation studies or refer to sports med vs hand specialist.  Follow up in 10-14 days or as needed

## 2018-08-03 ENCOUNTER — Encounter: Payer: Self-pay | Admitting: Medical

## 2018-08-04 LAB — URINE CULTURE
MICRO NUMBER:: 91164153
Result:: NO GROWTH
SPECIMEN QUALITY:: ADEQUATE

## 2018-08-15 DIAGNOSIS — N898 Other specified noninflammatory disorders of vagina: Secondary | ICD-10-CM | POA: Diagnosis not present

## 2018-08-15 DIAGNOSIS — A599 Trichomoniasis, unspecified: Secondary | ICD-10-CM | POA: Diagnosis not present

## 2018-08-15 DIAGNOSIS — N870 Mild cervical dysplasia: Secondary | ICD-10-CM | POA: Diagnosis not present

## 2018-08-15 DIAGNOSIS — R87810 Cervical high risk human papillomavirus (HPV) DNA test positive: Secondary | ICD-10-CM | POA: Diagnosis not present

## 2018-08-15 DIAGNOSIS — R87610 Atypical squamous cells of undetermined significance on cytologic smear of cervix (ASC-US): Secondary | ICD-10-CM | POA: Diagnosis not present

## 2018-12-17 DIAGNOSIS — N898 Other specified noninflammatory disorders of vagina: Secondary | ICD-10-CM | POA: Diagnosis not present

## 2019-02-03 ENCOUNTER — Other Ambulatory Visit: Payer: Self-pay | Admitting: Medical

## 2019-04-05 IMAGING — DX DG CERVICAL SPINE COMPLETE 4+V
6 series · 6 of 6 positions shown · non-contrast
Comparison: January 21, 2009

CLINICAL DATA: Cervicalgia with right-sided radicular symptoms

EXAM:
CERVICAL SPINE - COMPLETE 4+ VIEW

[c-spine lat]
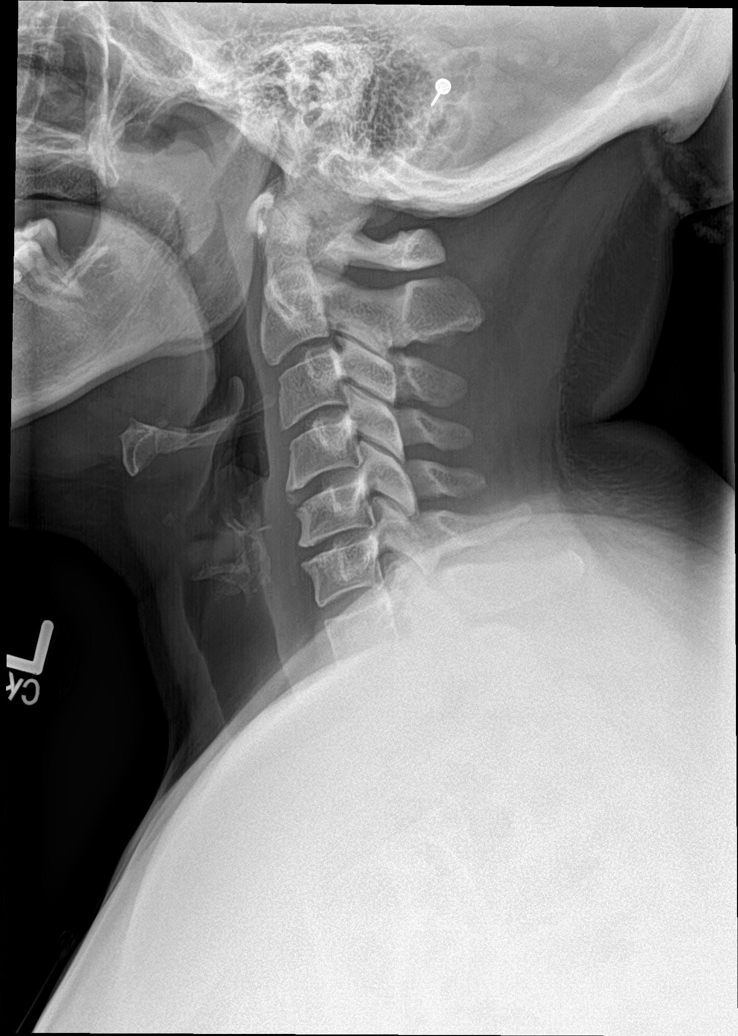

[c-spine obl (1 of 2)]
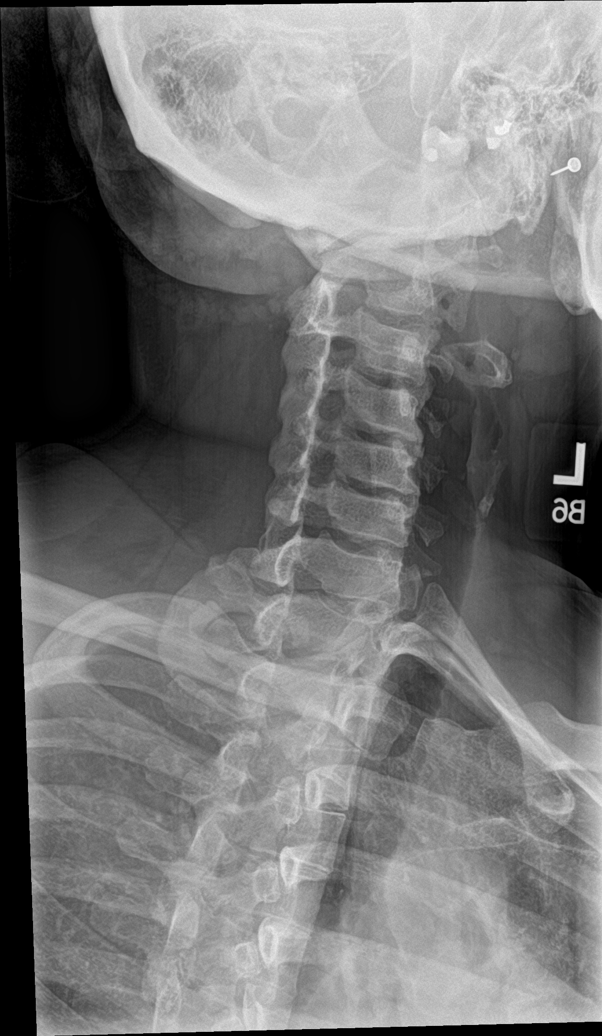

[c-spine obl (2 of 2)]
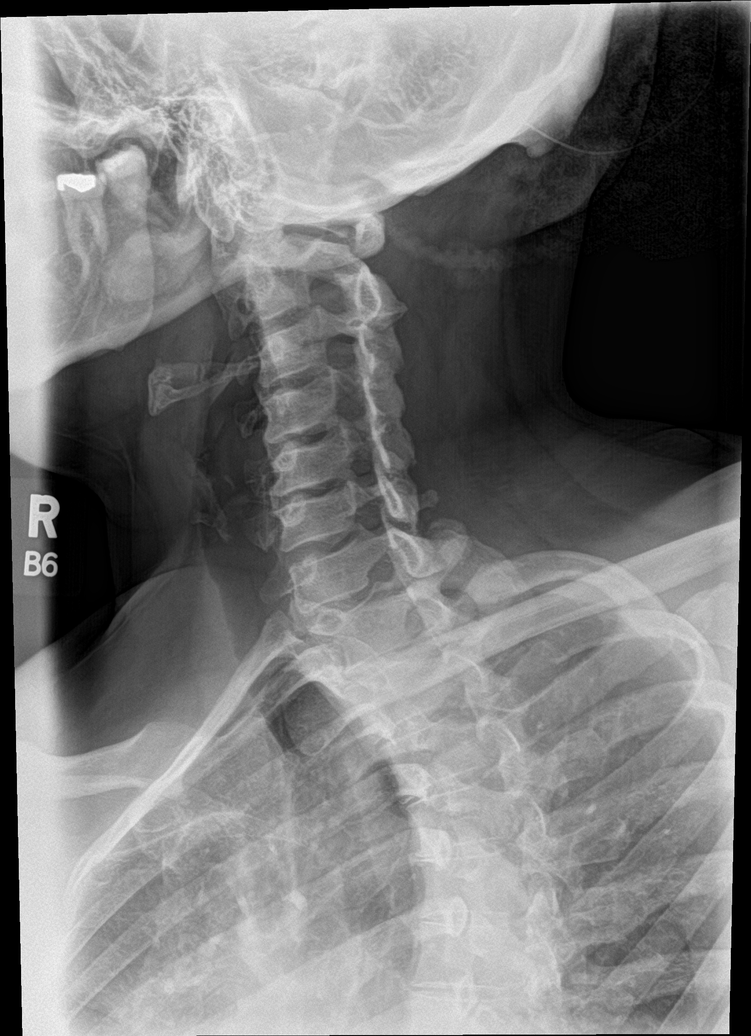

[c-spine ap]
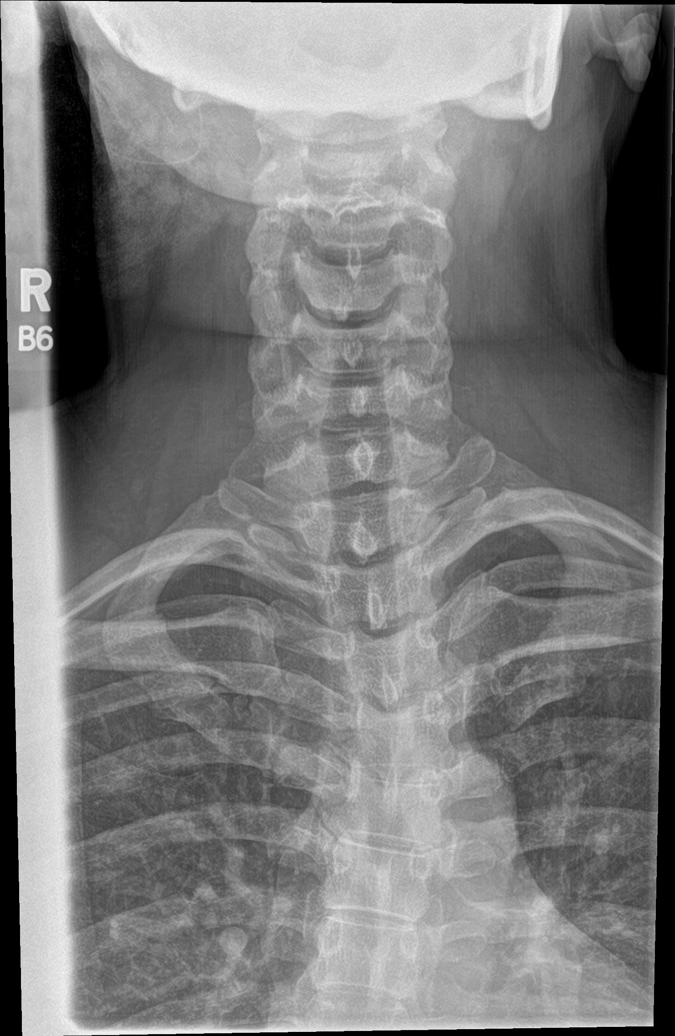

[c-spine open mouth]
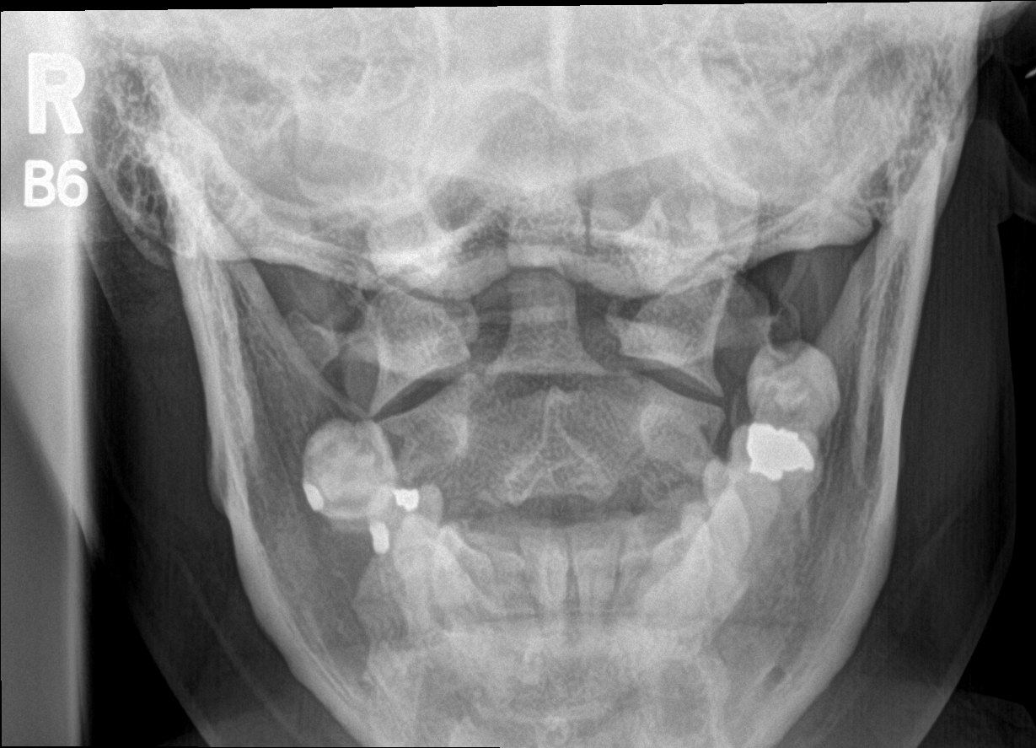

[c-spine swimmers]
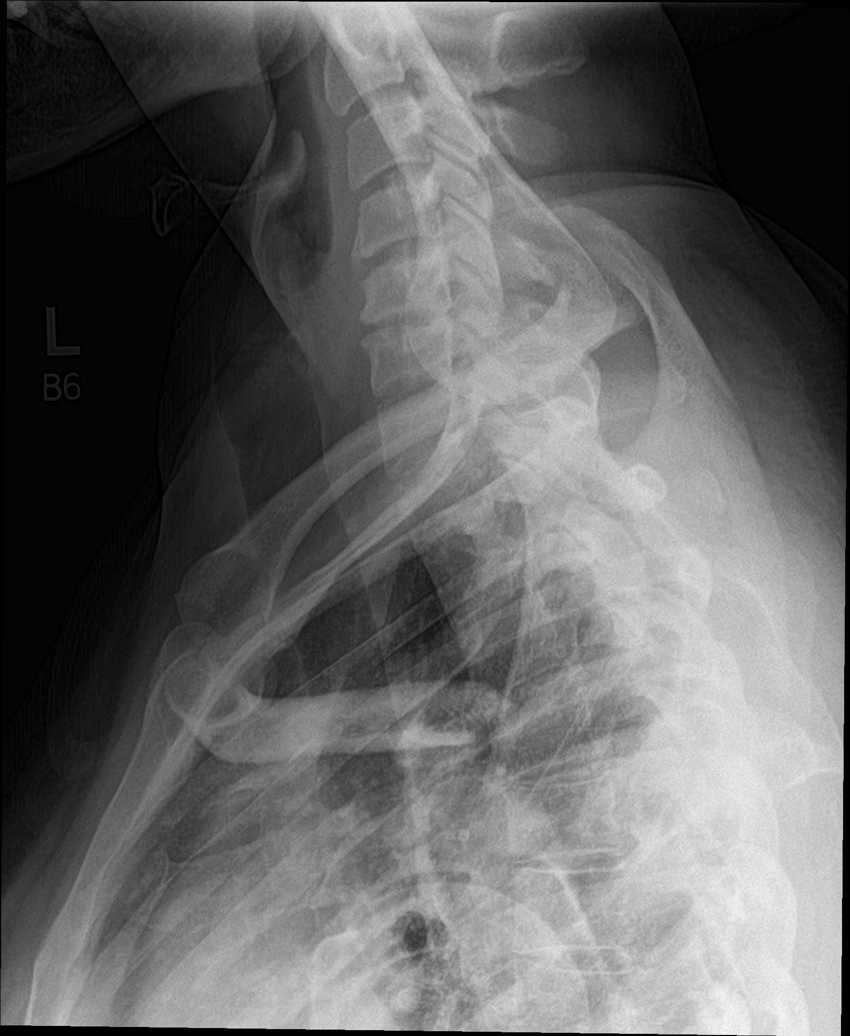

[6 of 6 positions shown; findings below may reference images not displayed]

FINDINGS: Frontal, lateral, open-mouth odontoid, and bilateral oblique views
were obtained. There is upper thoracic levoscoliosis. There is no
fracture or spondylolisthesis. Prevertebral soft tissues and
predental space regions are normal. There is mild disc space
narrowing at C5-6. Other disc spaces appear unremarkable. There are
small anterior osteophytes at C4, C5, and C6. There is no
appreciable exit foraminal narrowing on the oblique views. There is
mild reversal of lordotic curvature.
IMPRESSION: Mild reversal of lordotic curvature, a finding most likely
indicative of a degree of muscle spasm. There is mild disc space
narrowing at C5-6, a finding not present on previous study. No
fracture or spondylolisthesis.

## 2019-04-18 DIAGNOSIS — M79641 Pain in right hand: Secondary | ICD-10-CM | POA: Diagnosis not present

## 2019-07-17 DIAGNOSIS — Z20828 Contact with and (suspected) exposure to other viral communicable diseases: Secondary | ICD-10-CM | POA: Diagnosis not present

## 2019-08-15 DIAGNOSIS — Z1231 Encounter for screening mammogram for malignant neoplasm of breast: Secondary | ICD-10-CM | POA: Diagnosis not present

## 2019-08-15 DIAGNOSIS — Z3040 Encounter for surveillance of contraceptives, unspecified: Secondary | ICD-10-CM | POA: Diagnosis not present

## 2019-08-15 DIAGNOSIS — Z760 Encounter for issue of repeat prescription: Secondary | ICD-10-CM | POA: Diagnosis not present

## 2019-08-15 DIAGNOSIS — Z113 Encounter for screening for infections with a predominantly sexual mode of transmission: Secondary | ICD-10-CM | POA: Diagnosis not present

## 2019-08-15 DIAGNOSIS — Z01419 Encounter for gynecological examination (general) (routine) without abnormal findings: Secondary | ICD-10-CM | POA: Diagnosis not present

## 2019-08-15 DIAGNOSIS — N870 Mild cervical dysplasia: Secondary | ICD-10-CM | POA: Diagnosis not present
